# Patient Record
Sex: Female | Born: 1940 | Race: Black or African American
Health system: Southern US, Community
[De-identification: ages and names within clinical notes are randomized; demographics above are authoritative.]

## PROBLEM LIST (undated history)

## (undated) DIAGNOSIS — E785 Hyperlipidemia, unspecified: Secondary | ICD-10-CM

## (undated) DIAGNOSIS — I10 Essential (primary) hypertension: Secondary | ICD-10-CM

## (undated) HISTORY — DX: Hyperlipidemia, unspecified: E78.5

## (undated) HISTORY — DX: Essential (primary) hypertension: I10

## (undated) HISTORY — PX: ROTATOR CUFF REPAIR: SHX139

## (undated) HISTORY — PX: REPLACEMENT TOTAL KNEE: SUR1224

## (undated) HISTORY — PX: OTHER SURGICAL HISTORY: SHX169

## (undated) HISTORY — PX: CATARACT EXTRACTION: SUR2

---

## 1997-10-14 ENCOUNTER — Ambulatory Visit (HOSPITAL_COMMUNITY): Admission: RE | Admit: 1997-10-14 | Discharge: 1997-10-14 | Payer: Self-pay | Admitting: *Deleted

## 2000-04-15 ENCOUNTER — Encounter: Admission: RE | Admit: 2000-04-15 | Discharge: 2000-05-06 | Payer: Self-pay | Admitting: Occupational Medicine

## 2000-10-30 ENCOUNTER — Encounter: Admission: RE | Admit: 2000-10-30 | Discharge: 2000-10-30 | Payer: Self-pay | Admitting: Occupational Medicine

## 2000-10-30 ENCOUNTER — Encounter: Payer: Self-pay | Admitting: Occupational Medicine

## 2001-02-05 ENCOUNTER — Encounter: Payer: Self-pay | Admitting: Family Medicine

## 2001-02-05 ENCOUNTER — Ambulatory Visit (HOSPITAL_COMMUNITY): Admission: RE | Admit: 2001-02-05 | Discharge: 2001-02-05 | Payer: Self-pay | Admitting: Family Medicine

## 2001-02-12 ENCOUNTER — Encounter: Payer: Self-pay | Admitting: Family Medicine

## 2001-02-12 ENCOUNTER — Encounter: Admission: RE | Admit: 2001-02-12 | Discharge: 2001-02-12 | Payer: Self-pay | Admitting: Family Medicine

## 2001-03-31 ENCOUNTER — Encounter: Admission: RE | Admit: 2001-03-31 | Discharge: 2001-04-30 | Payer: Self-pay | Admitting: Neurosurgery

## 2001-11-24 ENCOUNTER — Ambulatory Visit (HOSPITAL_COMMUNITY): Admission: RE | Admit: 2001-11-24 | Discharge: 2001-11-24 | Payer: Self-pay | Admitting: Family Medicine

## 2001-11-24 ENCOUNTER — Encounter: Payer: Self-pay | Admitting: Family Medicine

## 2003-07-29 ENCOUNTER — Encounter: Admission: RE | Admit: 2003-07-29 | Discharge: 2003-07-29 | Payer: Self-pay | Admitting: Family Medicine

## 2003-09-08 ENCOUNTER — Ambulatory Visit (HOSPITAL_COMMUNITY): Admission: RE | Admit: 2003-09-08 | Discharge: 2003-09-08 | Payer: Self-pay | Admitting: Neurology

## 2004-04-05 ENCOUNTER — Emergency Department (HOSPITAL_COMMUNITY): Admission: EM | Admit: 2004-04-05 | Discharge: 2004-04-05 | Payer: Self-pay | Admitting: Emergency Medicine

## 2005-05-29 ENCOUNTER — Emergency Department (HOSPITAL_COMMUNITY): Admission: EM | Admit: 2005-05-29 | Discharge: 2005-05-29 | Payer: Self-pay | Admitting: Family Medicine

## 2005-07-08 ENCOUNTER — Encounter: Admission: RE | Admit: 2005-07-08 | Discharge: 2005-08-08 | Payer: Self-pay | Admitting: Family Medicine

## 2005-07-16 ENCOUNTER — Emergency Department (HOSPITAL_COMMUNITY): Admission: EM | Admit: 2005-07-16 | Discharge: 2005-07-16 | Payer: Self-pay | Admitting: Family Medicine

## 2006-03-17 ENCOUNTER — Encounter: Admission: RE | Admit: 2006-03-17 | Discharge: 2006-03-17 | Payer: Self-pay | Admitting: Family Medicine

## 2006-11-06 ENCOUNTER — Ambulatory Visit: Payer: Self-pay | Admitting: *Deleted

## 2006-11-06 ENCOUNTER — Encounter: Payer: Self-pay | Admitting: Emergency Medicine

## 2006-11-07 ENCOUNTER — Ambulatory Visit: Payer: Self-pay | Admitting: Internal Medicine

## 2006-11-07 ENCOUNTER — Inpatient Hospital Stay (HOSPITAL_COMMUNITY): Admission: EM | Admit: 2006-11-07 | Discharge: 2006-11-08 | Payer: Self-pay | Admitting: Cardiology

## 2006-12-18 ENCOUNTER — Ambulatory Visit (HOSPITAL_COMMUNITY): Admission: RE | Admit: 2006-12-18 | Discharge: 2006-12-18 | Payer: Self-pay | Admitting: Gastroenterology

## 2006-12-18 ENCOUNTER — Encounter (INDEPENDENT_AMBULATORY_CARE_PROVIDER_SITE_OTHER): Payer: Self-pay | Admitting: Gastroenterology

## 2007-02-06 ENCOUNTER — Encounter: Admission: RE | Admit: 2007-02-06 | Discharge: 2007-05-07 | Payer: Self-pay | Admitting: Internal Medicine

## 2007-05-14 ENCOUNTER — Encounter: Admission: RE | Admit: 2007-05-14 | Discharge: 2007-05-14 | Payer: Self-pay | Admitting: Family Medicine

## 2007-08-03 ENCOUNTER — Ambulatory Visit (HOSPITAL_COMMUNITY): Admission: RE | Admit: 2007-08-03 | Discharge: 2007-08-03 | Payer: Self-pay | Admitting: Internal Medicine

## 2007-09-27 ENCOUNTER — Encounter: Admission: RE | Admit: 2007-09-27 | Discharge: 2007-09-27 | Payer: Self-pay | Admitting: Family Medicine

## 2007-10-07 ENCOUNTER — Encounter: Admission: RE | Admit: 2007-10-07 | Discharge: 2007-10-07 | Payer: Self-pay | Admitting: Family Medicine

## 2007-11-19 ENCOUNTER — Ambulatory Visit (HOSPITAL_BASED_OUTPATIENT_CLINIC_OR_DEPARTMENT_OTHER): Admission: RE | Admit: 2007-11-19 | Discharge: 2007-11-20 | Payer: Self-pay | Admitting: Orthopaedic Surgery

## 2007-12-02 ENCOUNTER — Encounter: Admission: RE | Admit: 2007-12-02 | Discharge: 2007-12-23 | Payer: Self-pay | Admitting: Orthopaedic Surgery

## 2008-11-14 IMAGING — MG MM SCREEN MAMMOGRAM BILATERAL
5 series · 5 of 5 positions shown · non-contrast
Comparison: Prior studies.

DG SCREEN MAMMOGRAM BILATERAL
Bilateral CC and MLO view(s) were taken.

DIGITAL SCREENING MAMMOGRAM WITH CAD:

[R CC]
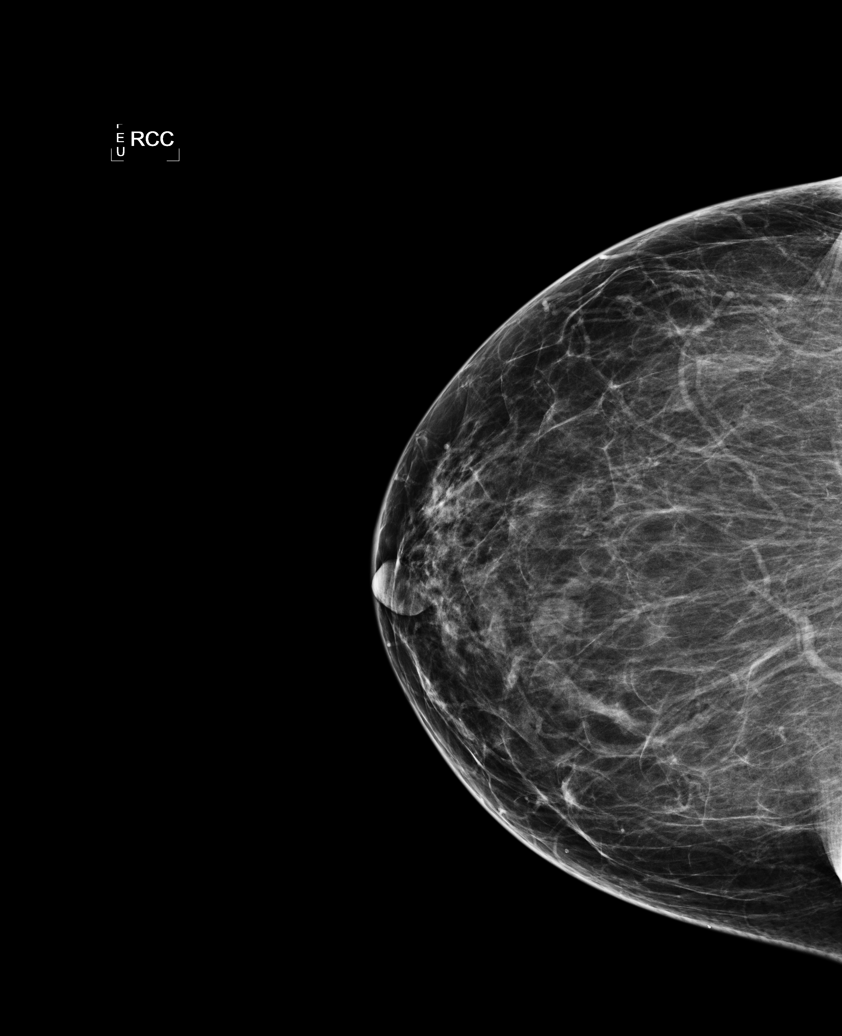

[L CC]
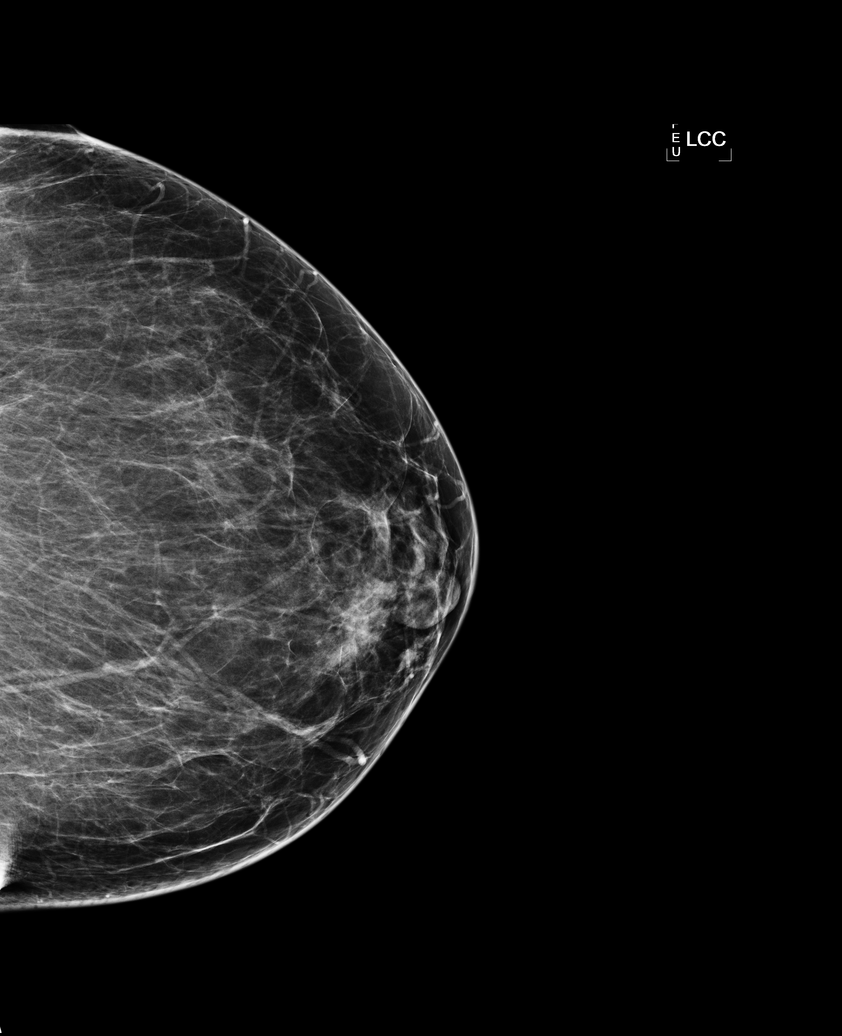

[L MLO]
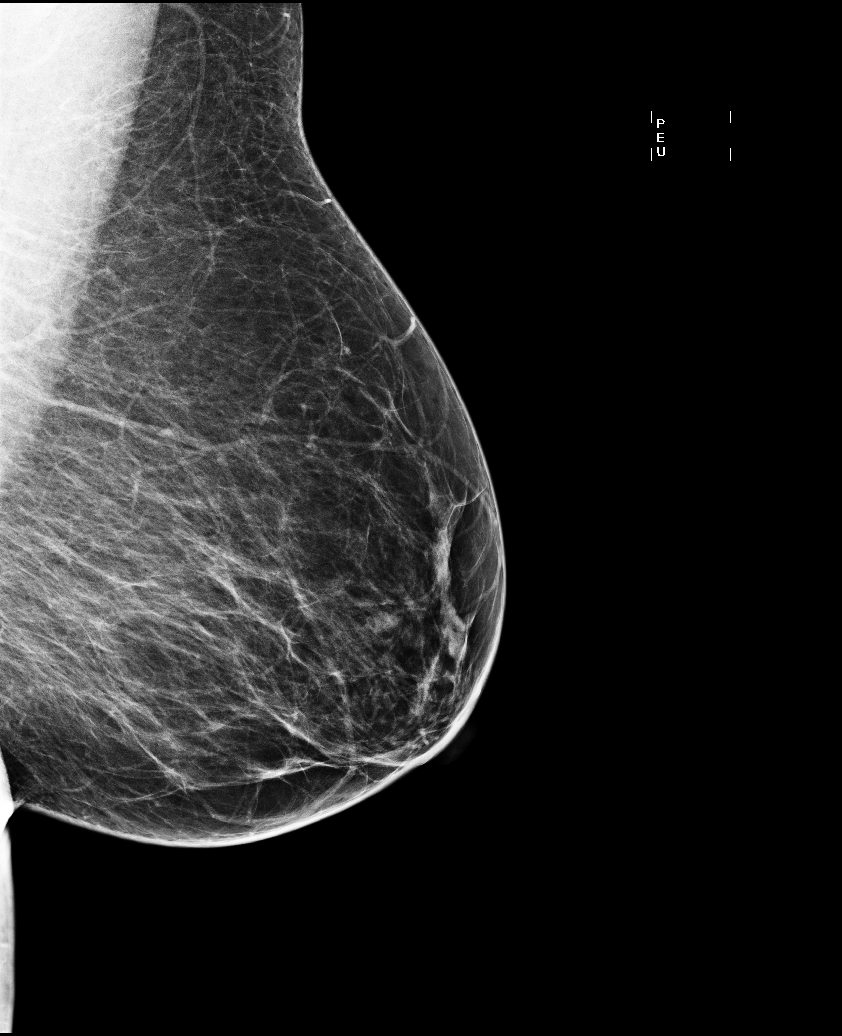

[R MLO (1 of 2)]
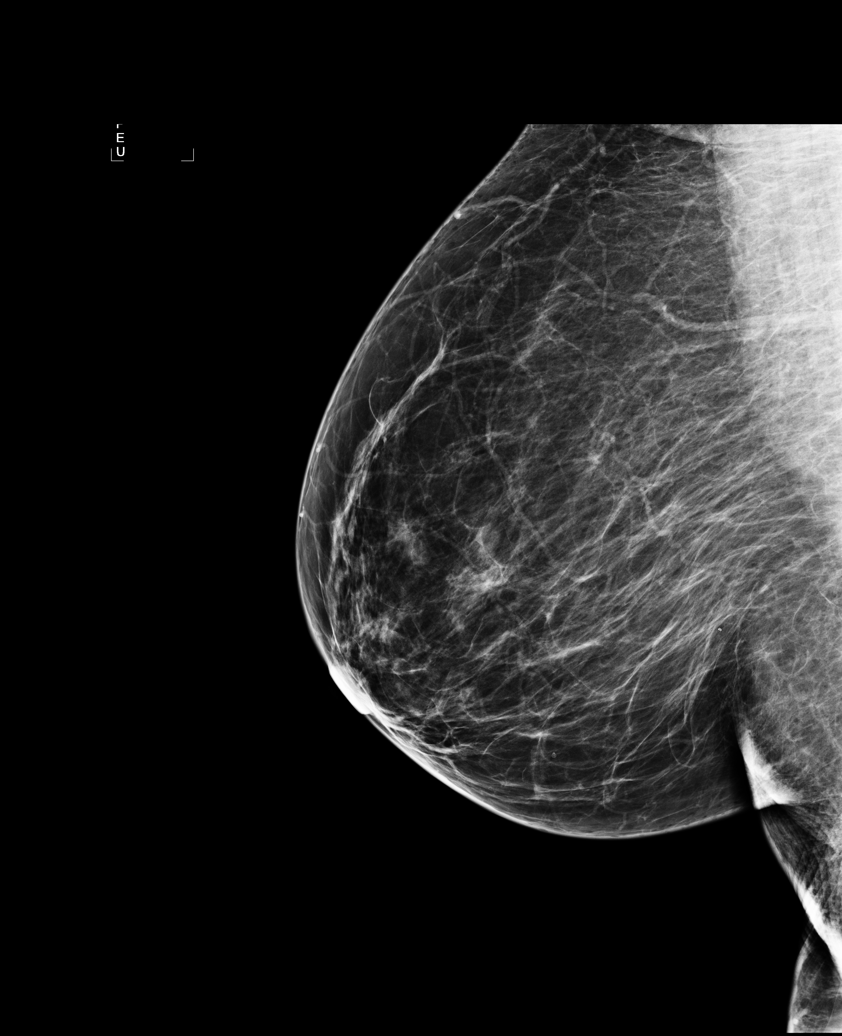

[R MLO (2 of 2)]
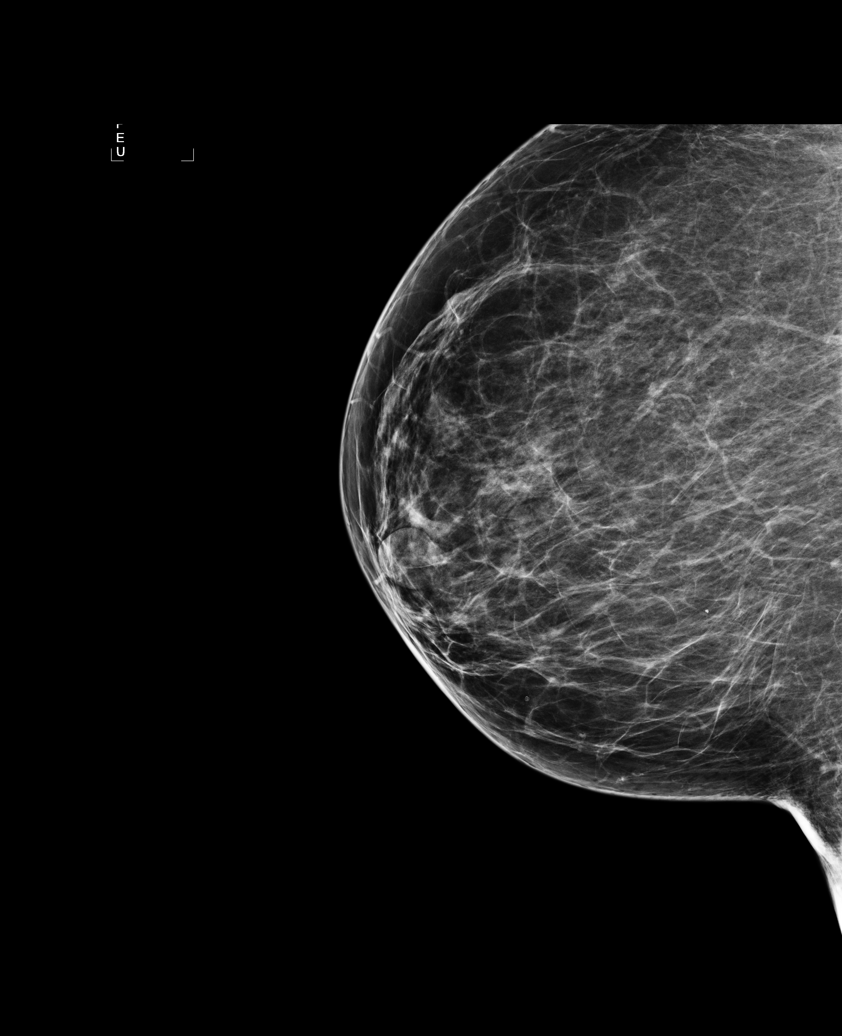

[5 of 5 positions shown; findings below may reference images not displayed]

There are scattered fibroglandular densities.  There is no dominant mass, architectural distortion 
or calcification to suggest malignancy.
IMPRESSION: No mammographic evidence of malignancy.  Suggest yearly screening mammography.

ASSESSMENT: Negative - BI-RADS 1

Screening mammogram in 1 year.
ANALYZED BY COMPUTER AIDED DETECTION. , THIS PROCEDURE WAS A DIGITAL MAMMOGRAM.

## 2009-01-31 ENCOUNTER — Encounter: Admission: RE | Admit: 2009-01-31 | Discharge: 2009-01-31 | Payer: Self-pay | Admitting: Family Medicine

## 2010-07-21 ENCOUNTER — Encounter: Payer: Self-pay | Admitting: Family Medicine

## 2010-11-13 NOTE — Cardiovascular Report (Signed)
NAMEMADELINE, PHO                   ACCOUNT NO.:  [id number]   MEDICAL RECORD NO.:  [id number]          PATIENT TYPE:  INP   LOCATION:  3708                         FACILITY:  [facility]   PHYSICIAN:  Arturo Morton. Riley Kill, MD, FACCDATE OF BIRTH:  12-Mar-1941   DATE OF PROCEDURE:  11/07/2006  DATE OF DISCHARGE:                            CARDIAC CATHETERIZATION   INDICATIONS:  Mrs. Preyer is a 70 year old female who works on [employer].  She  presented with chest pain.  She was noted have a potassium of 3.1.  She  was seen in consultation by Dr. Dietrich Pates, and subsequently cardiac  catheterization was recommended.  Incidental finding included a  microcytic anemia.  The etiology of this is unknown.   PROCEDURE:  1. Left heart catheterization.  2. Selective coronary arteriography.  3. Selective left ventriculography.   DESCRIPTION OF PROCEDURE:  Following informed consent, the patient was  brought to the catheterization laboratory, prepped and draped in usual  fashion and 1 mg of intravenous Versed was administered.  Xylocaine was  used for local anesthesia and through an anterior puncture, the right  femoral artery was easily entered on the first stick.  A 5-French sheath  was placed.  Views of the left and right coronaries were then obtained  in multiple angiographic projections.  Central aortic and left  ventricular pressures were measured with a pigtail.  Ventriculography  was done in the RAO projection.  There were no complications.  She was  taken to the holding area in satisfactory clinical condition.  Because  of the potassium of 3.3, she was given an additional 40 mEq of potassium  supplementation.   HEMODYNAMIC DATA:  1. Central aortic pressure 151/76.  2. Left ventricular pressure 154/17.  3. No gradient on pull-back across the aortic valve.   ANGIOGRAPHIC DATA:  1. The left main coronary is free of critical disease.  2. Left anterior descending artery courses to the apex.  There are  two      smaller diagonal branches, then a moderate size diagonal branch.      The distal LAD wraps the apex.  The vessel was moderately large and      free of critical narrowing.  3. The circumflex provides a large marginal branch, and also an AV      circumflex.  This vessel throughout appears smooth and without      significant narrowing and perhaps is slightly hypertrophied.  4. The right coronary artery provides a posterior descending and three      posterolateral branches, all of which appear to be smooth and free      of critical disease.   Ventriculography in the RAO projection reveals preserved global systolic  function.  Estimated ejection fraction is around 60%.  No definite wall  motion abnormalities are seen although there is a slight hangup in the  inferobasal segment.  Ejection fraction would be felt to be normal.   CONCLUSION:  1. Preserved overall left ventricular function.  2. Large nonobstructive coronary arteries as described in the above      text.  IMPRESSION:  1. Recent chest pain.  2. Hypokalemia secondary to probable diuretics.  3. Microcytic anemia of uncertain etiology.   RECOMMENDATIONS:  1. Replace potassium.  2. Follow up with Dr. bland regarding her microcytic anemia.  This has      been discussed with the patient and also with Dr. Parke Simmers directly      and we will obtain the iron studies which will be followed up on in      the outpatient area.      Arturo Morton. Riley Kill, MD, [facility]     TDS/MEDQ  D:  11/07/2006  T:  11/08/2006  Job:  [id number]   cc:   Pricilla Riffle, MD, [facility]  Renaye Rakers, M.D.  CV Laboratory

## 2010-11-13 NOTE — Discharge Summary (Signed)
[name], [name]                   ACCOUNT NO.:  [id number]   MEDICAL RECORD NO.:  [id number]          PATIENT TYPE:  INP   LOCATION:  3708                         FACILITY:  [facility]   PHYSICIAN:  Pricilla Riffle, MD, FACCDATE OF BIRTH:  [birth year]   DATE OF ADMISSION:  11/07/2006  DATE OF DISCHARGE:  11/08/2006                               DISCHARGE SUMMARY   PRIMARY CARE Aakash Hollomon:  Dr. Parke Simmers.   PRINCIPAL DIAGNOSIS:  Chest pain.   SECONDARY DIAGNOSES:  1. Hypertension.  2. Hypokalemia.  3. Microcytic anemia.   ALLERGIES:  No known drug allergies.   PROCEDURE:  Left heart cardiac catheterization.   HISTORY OF PRESENT ILLNESS:  A 70 year old female with a prior history  of hypertension, who was in her usual state of health until  approximately 11 a.m. on Nov 06, 2006, when she developed sudden onset of  left-sided chest pressure with radiation to the left upper extremity and  down the left arm.  This was worse with exertion and improved with rest.  Chest discomfort was intermittent throughout the day, thus prompting the  patient to present to the [facility] ED where she was evaluated.  Cardiac markers were negative, and ECG was without acute changes.  She  was admitted for further evaluation.   HOSPITAL COURSE:  [name] ruled out for MI, and decision was made to  pursue left heart cardiac catheterization.  Catheterization took place  on May 9 revealing normal coronary arteries with normal LV function.  This morning, she had been ambulating without recurrent discomfort or  other mentations.  She is being discharged home today in satisfactory  condition.   DISCHARGE LABS:  Hemoglobin 10.8, hematocrit 33.8, WBC 5.9, platelets  234, MCV 68.9, sodium 144, potassium 4.4, chloride 104, CO2 30, BUN 13,  creatinine 0.85, glucose 80, PT 14.3, INR 1.1, PTT 35, total bilirubin  0.7, alkaline phosphatase 70, AST 17, ALT 16, albumin 3.4, CK 73, MB  1.6, troponin I 0.03, total cholesterol  163, triglycerides 18, HDL 60,  LDL 99, calcium 9.3, magnesium 2.1.  Serum iron, TIBC, ferritin,  reticulocytes are pending.  D-dimer was less than 0.22.  BNP was less  than 30.  TSH was 2.349.   DISPOSITION:  The patient is being discharged home today in good  condition.   FOLLOWUP PLANS AND APPOINTMENTS:  She is asked to follow up with her  primary care Landyn Lorincz, Dr. Parke Simmers, in approximately 1-2 weeks for further  evaluation of microcytic anemia.  She does have iron studies pending  here.   DISCHARGE MEDICATIONS:  1. Norvasc 10 mg daily.  2. Diovan/HCTZ 160/25 mg daily.  3. Trazodone as previously prescribed.  4. Aspirin 81 mg daily.  5. K-Dur 10 mEq daily.   OUTSTANDING LAB STUDIES:  Iron, ferritin, TIBC, and reticulocytes.   DURATION OF DISCHARGE ENCOUNTER:  31 minutes including physician time.      Nicolasa Ducking, ANP      Pricilla Riffle, MD, [facility]  Electronically Signed    CB/MEDQ  D:  11/08/2006  T:  11/08/2006  Job:  [id number]   cc:   Renaye Rakers, M.D.

## 2010-11-13 NOTE — H&P (Signed)
NAMEMARKERIA, GOETSCH                   ACCOUNT NO.:  [id number]   MEDICAL RECORD NO.:  [id number]          PATIENT TYPE:  EMS   LOCATION:  ED                           FACILITY:  [facility]   PHYSICIAN:  Rod Holler, MD     DATE OF BIRTH:  [birth year]   DATE OF ADMISSION:  11/06/2006  DATE OF DISCHARGE:                              HISTORY & PHYSICAL   CHIEF COMPLAINT:  Chest pain.   HISTORY OF PRESENT ILLNESS:  [name] is a 70 year old female with a  history of hypertension who presents to the emergency department with  complaints of chest pain.  At 11:00 today while at rest, the patient had  onset of left-sided chest pressure, dull pain with radiation to her left  upper extremity and down the left arm. This discomfort was made worse  with walking up stairs and improved with rest.  The discomfort was  associated with some shortness of breath, nausea, diaphoresis.  The  chest discomfort continued throughout the day, continued to worsen with  activity and presents to the emergency department.  In the ER, the  patient was being started on nitroglycerin which improved her  discomfort.  At present, the patient's pain is much improved.  The  patient has had no recent syncope, palpitations, PND, or orthopnea or .  Lower extremity slowing.  In the emergency department, the patient was  given aspirin, nitroglycerin drip, morphine, Zofran, Lovenox 1 mg per kg  subcutaneously.   PAST MEDICAL HISTORY:  Hypertension.   MEDICATIONS:  1. Aspirin 81 mg p.o. daily.  2. Norvasc 5 mg po daily.  3. Diovan/HCT 160/25, one tab p.o. daily.   ALLERGIES:  No known drug allergies.   SOCIAL HISTORY:  The patient works as a Chief Strategy Officer at [employer].  Denies tobacco use.   FAMILY HISTORY:  No known history of coronary artery disease.   REVIEW OF SYSTEMS:  All systems were reviewed in detail and are negative  except as noted in the history of present illness.   PHYSICAL EXAMINATION:  VITAL SIGNS:   Blood pressure 135/72, heart rate  60, respiratory rate 18, oxygen saturation 99%.  GENERAL:  Well-developed, well-nourished female, alert and oriented x3,  in no apparent distress.  HEENT:  Atraumatic, normocephalic.  Pupils are equal, round and reactive  to light.  Extraocular movements intact.  Oropharynx clear.  NECK:  Supple.  No adenopathy.  No JVD.  No carotid bruits.  CHEST:  Lungs clear to auscultation bilaterally with equal bilateral  breath sounds.  CARDIOVASCULAR:  Regular rhythm.  Normal rate.  Normal S1 and S2.  No  murmurs, rubs or gallops. 2+ peripheral pulses.  ABDOMEN:  Soft, nontender, nondistended.  Active bowel sounds.  EXTREMITIES:  No clubbing or cyanosis.  NEUROLOGIC:  No focal deficits.   LABORATORY DATA:  EKG shows normal sinus rhythm with nonspecific ST-T  wave changes, questionable old septal infarct.  White blood cell count  8.1, hematocrit 33.8, MCV 68, platelet count 249.  Sodium 137, potassium  3.1, chloride 101, bicarb 29, BUN 23, creatinine  1.3, glucose 107,  myoglobin 57, troponin less than 0.05.  CK-MB 1.3.   IMPRESSION:  A 70 year old female who presents with complaints of chest  pain that is worse for acute coronary syndrome.  It is reassuring that  the pain has persistedfor approximately 12 hours with no elevation in  cardiac enzymes.   PLAN:  1. Admit the patient to a telemetry bed.  2. Transfer to [facility].  3. Aspirin.  4. Lipitor.  5. Continue home dose ARB and Norvasc. Given that the patient's heart      rate is in the 50s, unable to give beta blocker at this time.  The      patient was given therapeutic dose of Lovenox in the emergency      department, which we will continue.  6. Lipids in the morning along with serial cardiac enzymes.  7. Daily EKG.  8. Nitroglycerin drip, titrate until chest pain free.  9. NPO after midnight.  10.Replete potassium.  11.Normal saline 100 ml/hour.  12.Guaiac all stools.      Rod Holler, MD  Electronically Signed     TRK/MEDQ  D:  11/07/2006  T:  11/07/2006  Job:  [id number]

## 2010-11-13 NOTE — Op Note (Signed)
[name], [name]                   ACCOUNT NO.:  [id number]   MEDICAL RECORD NO.:  [id number]          PATIENT TYPE:  AMB   LOCATION:  DSC                          FACILITY:  [facility]   PHYSICIAN:  Claude Manges. Whitfield, M.D.DATE OF BIRTH:  11/07/1940   DATE OF PROCEDURE:  11/19/2007  DATE OF DISCHARGE:                               OPERATIVE REPORT   PREOPERATIVE DIAGNOSIS:  Rotator cuff tear, left shoulder with  degenerative joint disease, acromioclavicular joint.   POSTOPERATIVE DIAGNOSIS:  Rotator cuff tear, left shoulder with  degenerative joint disease, acromioclavicular joint.   PROCEDURE:  1. Arthroscopic debridement of left shoulder.  2. Arthroscopic subacromial decompression.  3. Arthroscopic distal clavicle resection.  4. Mini-open rotator cuff tear repair.   SURGEON:  Claude Manges. Cleophas Dunker, MD   ASSISTANT:  Oris Drone. Petrarca, P.A.-C.   ANESTHESIA:  General with supplemental interscalene nerve block.   COMPLICATIONS:  None.   HISTORY:  A 70 year old, a [employer] employee injured her left shoulder  on Oct 30, 2006, when she was helping a nurse pull the patient up in bed.  She experienced acute onset of pain, which reached to the point where  she was unable to perform activity overhead.  Despite physical therapy  and medicine, she continued to have discomfort.  She had an MRI scan  performed on September 27, 2007, revealing severe tendinopathy of the supra  and infraspinatus tendons with a full-thickness tear of the distal  infraspinatus.  There was also AC joint degenerative changes with  proliferative spurring that may result in impingement.  She is now a 70 year post injury, and continues to have pain and wishes to proceed with  arthroscopic evaluation.   PROCEDURE:  With the patient comfortable on operating table and under  general orotracheal anesthesia, the patient was placed in semi-sitting  position with the shoulder frame.  The left shoulder was then prepped  with  DuraPrep in the base of the neck circumferentially and below the  elbow, sterile draping was performed.   Marking pen was used to outline the [facility] joint, the coracoid, and the  acromion at a point of fingerbreadth posterior medial to the posterior  angle of acromion, a small stab wound was made.  The arthroscope was  easily placed into the shoulder joint.  Diagnostic arthroscopy revealed  partial tearing of the supra and infraspinatus attachment to the humeral  head.  There was no evidence of loose material.  There was minimal  synovitis.  I did not appreciate significant chondromalacia.  There was  some grade 2 changes of the glenoid.  The biceps tendon was intact.   A second portal was established anteriorly.  A series of the ArthroCare  wand and Cuda shaver, I debrided the partial rotator cuff tear.   The arthroscope was then placed in subacromial space posteriorly.  The  cannula subacromial space anteriorly and a third portal was established  in the lateral subacromial space.  There was considerable bursal tissue  that was debrided with the ArthroCare wand and the Cuda shaver.  I could  then  visualize the rotator cuff.  There appeared to be some bursal  surface tearing of the muscular portion of the cuff.  I did not see an  obvious tear of the tendinous portion of the infra and supraspinatus.  I  did a bursectomy.  There was considerable overhang of both the anterior  and lateral acromion, and a 6-mm bur was used to do an anterior and  lateral acromionectomy.  With evidence of significant degenerative  change with impingement of the [facility] joint, the distal clavicle resection  was performed with a 6-mm bur as well had a very nice resection.  There  was some minimal synovitis at the joint which was also resected.   With evidence of a cuff tear by MRI scan, I elected to perform a mini-  open incision.  About an inch incision was then made over the anterior  aspect of the shoulder and  carried down to the subcutaneous tissue.  Gross bleeders of Bovie coagulated.  The deltoid fascia was incised and  then separated.  The subacromial space was then identified.  There  appeared to be some tearing of the muscular portion of the cuff beneath  the [facility] joint and hypertrophied acromion.  I carefully checked the infra  and supraspinatus insertion.  I did not see an obvious tear, but I did  repair the frayed portions of the muscle, so that it was perfectly  intact.  The subacromial space was irrigated with saline solution.  The  deltoid fascia was closed with an interrupted 0 Vicryl, subcu with 2-0  Vicryl, and the skin was closed with Steri-Strips over Benzoin.  A  sterile bulky dressing was applied followed by a sling.   PLAN:  Recovery.   Discharged on [facility], for Pain Office 1 week.      Claude Manges. Cleophas Dunker, M.D.  Electronically Signed     PWW/MEDQ  D:  11/19/2007  T:  11/20/2007  Job:  [id number]

## 2011-03-27 LAB — BASIC METABOLIC PANEL
BUN: 19
Calcium: 9.3
Chloride: 102
Creatinine, Ser: 0.98
GFR calc non Af Amer: 57 — ABNORMAL LOW
Potassium: 3.8

## 2018-09-02 ENCOUNTER — Encounter (INDEPENDENT_AMBULATORY_CARE_PROVIDER_SITE_OTHER): Payer: Self-pay | Admitting: Cardiovascular Disease

## 2018-09-02 ENCOUNTER — Ambulatory Visit (INDEPENDENT_AMBULATORY_CARE_PROVIDER_SITE_OTHER): Payer: Medicare Other | Admitting: Cardiovascular Disease

## 2018-09-02 VITALS — BP 152/81 | HR 73 | Temp 98.3°F | Ht 64.7 in | Wt 230.6 lb

## 2018-09-02 DIAGNOSIS — I639 Cerebral infarction, unspecified: Secondary | ICD-10-CM

## 2018-09-02 DIAGNOSIS — I10 Essential (primary) hypertension: Secondary | ICD-10-CM

## 2018-09-02 MED ORDER — HYDROCHLOROTHIAZIDE 25 MG PO TABS
25.0000 mg | ORAL_TABLET | Freq: Every day | ORAL | 3 refills | Status: DC
Start: 2018-09-02 — End: 2018-09-09

## 2018-09-02 MED ORDER — AMLODIPINE BESYLATE 10 MG PO TABS
10.0000 mg | ORAL_TABLET | Freq: Every day | ORAL | 3 refills | Status: DC
Start: 2018-09-02 — End: 2018-09-09

## 2018-09-02 MED ORDER — ASPIRIN 81 MG PO CHEW
81.00 mg | CHEWABLE_TABLET | Freq: Every day | ORAL | 3 refills | Status: AC
Start: 2018-09-02 — End: 2018-12-01

## 2018-09-02 MED ORDER — VALSARTAN 320 MG PO TABS
320.00 mg | ORAL_TABLET | Freq: Every day | ORAL | 3 refills | Status: AC
Start: 2018-09-02 — End: 2018-12-01

## 2018-09-02 MED ORDER — ATORVASTATIN CALCIUM 40 MG PO TABS
40.0000 mg | ORAL_TABLET | Freq: Every day | ORAL | 3 refills | Status: DC
Start: 2018-09-02 — End: 2018-09-04

## 2018-09-02 NOTE — Progress Notes (Signed)
[facility] [place] OFFICE CONSULTATION      Chief Complaint   Patient presents with    Establish Care       I had the pleasure of seeing [name] today for cardiovascular evaluation. She is a pleasant 78 y.o. 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    She was referred for a possible TIA in December.  She awoke with sharp R-sided pain over her scalp.  The pain persisted down the R side of her head and she had R-sided weakness.  Could not walk well. She was taken to the hospital and was told she may have had a TIA. Symptoms lasted about an hour.  Carotid doppler showed mild disease.  MRI did not show any acute findings but chronic ischemic changes noted.  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 She has a PCP in [place] and family lives here in [place].    She has chronic DOE.  No exertional chest discomfort but occasional L sided pain at rest.  She states her SBPs are usually in the 130s-150s.  No LE edema.  She feels palpitations with climbing stairs, but otherwise denies palpitations.         PAST MEDICAL HISTORY:   Past Medical History:   Diagnosis Date    Hypertension          MEDICATIONS:     Current Outpatient Medications   Medication Sig Dispense Refill    amLODIPine (NORVASC) 10 MG tablet Take 1 tablet (10 mg total) by mouth daily 90 tablet 3    aspirin 81 MG chewable tablet Chew 1 tablet (81 mg total) by mouth daily 90 tablet 3    atorvastatin (LIPITOR) 40 MG tablet Take 1 tablet (40 mg total) by mouth daily 90 tablet 3    hydroCHLOROthiazide (HYDRODIURIL) 25 MG tablet Take 1 tablet (25 mg total) by mouth daily 90 tablet 3    valsartan (DIOVAN) 320 MG tablet Take 1 tablet (320 mg total) by mouth daily 90 tablet 3     No current facility-administered medications for this visit.            ALLERGIES: No Known Allergies      FAMILY HISTORY: Her family history includes Hypertension  in her brother, mother, and sister; No known problems in her father.  No premature CAD.      SOCIAL HISTORY: She reports that she has never smoked. She has never used smokeless tobacco. She reports that she does not drink alcohol or use drugs.      REVIEW OF SYSTEMS: All other systems reviewed and negative except as above.         PHYSICAL EXAMINATION  Vital Signs: BP 152/81 (BP Site: Right arm, Patient Position: Sitting, Cuff Size: Large)    Pulse 73    Temp 98.3 F (36.8 C)    Ht 1.643 m (5' 4.7")    Wt 104.6 kg (230 lb 9.6 oz)    SpO2 98%    BMI 38.73 kg/m    Vital signs reviewed    Wt Readings from Last 3 Encounters:   09/02/18 104.6 kg (230 lb 9.6 oz)        General Appearance:  A well-appearing female in no acute distress.    HEENT: Sclera anicteric, conjunctiva without pallor, moist mucous membranes, normal dentition.   Neck:  Supple without jugular venous distention.  Normal carotid upstrokes without bruits.   Chest: Clear to auscultation bilaterally with good air movement and respiratory effort and no wheezes, rales, or rhonchi   Cardiac: RRR.  Normal S1 and physiologically split S2, without gallops or rub. No murmurs.    Vascular:  2+ carotid, radial pulses bilaterally  Abdomen: Soft, nontender, nondistended, with normoactive bowel sounds.  No bruits.   Extremities: Warm without edema, clubbing, or cyanosis.   Skin: No rash, warm, appropriate for race.   Neuro: Alert and oriented x3. Grossly intact.  CN II-XII intact.  Normal mood and affect.       ECG:    Independent review shows:  NSR, LVH, NS T wave changes    ASSESSMENT/PLAN:    1. TIA.  Recommend starting statin.  Will get baseline lipid panel and echo.  2. HTN.  Uncontrolled.  Recommend increasing diovan to 320mg  daily and cont with HCTZ and amlodipine.  Check BMP.        All patient's questions and concerns regarding cardiovascular disease were answered during this visit.    Orders Placed This Encounter   Procedures    Basic Metabolic Panel     Lipid panel    ECG 12 lead (Normal)    Transthoracic Echocardiogram (TTE)       Return in about 2 weeks (around 09/16/2018).    Governor Specking, MD  09/02/2018

## 2018-09-04 ENCOUNTER — Ambulatory Visit
Admission: RE | Admit: 2018-09-04 | Discharge: 2018-09-04 | Disposition: A | Discharge status: Home / Self Care | Payer: Medicare Other | Source: Ambulatory Visit | Attending: Cardiovascular Disease | Admitting: Cardiovascular Disease

## 2018-09-04 ENCOUNTER — Ambulatory Visit (INDEPENDENT_AMBULATORY_CARE_PROVIDER_SITE_OTHER): Payer: Medicare Other

## 2018-09-04 ENCOUNTER — Other Ambulatory Visit (INDEPENDENT_AMBULATORY_CARE_PROVIDER_SITE_OTHER): Payer: Self-pay | Admitting: Cardiovascular Disease

## 2018-09-04 ENCOUNTER — Ambulatory Visit
Admission: RE | Admit: 2018-09-04 | Discharge: 2018-09-04 | Disposition: A | Discharge status: Home / Self Care | Payer: Medicare Other | Source: Ambulatory Visit | Attending: Neurology | Admitting: Neurology

## 2018-09-04 DIAGNOSIS — I639 Cerebral infarction, unspecified: Secondary | ICD-10-CM

## 2018-09-04 DIAGNOSIS — I10 Essential (primary) hypertension: Secondary | ICD-10-CM | POA: Insufficient documentation

## 2018-09-04 DIAGNOSIS — R51 Headache: Secondary | ICD-10-CM | POA: Insufficient documentation

## 2018-09-04 LAB — BASIC METABOLIC PANEL
BUN: 17 mg/dL (ref 7.0–19.0)
CO2: 30 mEq/L — ABNORMAL HIGH (ref 21–29)
Calcium: 9.2 mg/dL (ref 7.9–10.2)
Chloride: 102 mEq/L (ref 100–111)
Creatinine: 0.9 mg/dL (ref 0.4–1.5)
Glucose: 97 mg/dL (ref 70–100)
Potassium: 4.1 mEq/L (ref 3.5–5.1)
Sodium: 142 mEq/L (ref 136–145)

## 2018-09-04 LAB — GFR: EGFR: >60

## 2018-09-04 LAB — SEDIMENTATION RATE: Sed Rate: 16 mm/Hr (ref 0–20)

## 2018-09-04 LAB — LIPID PANEL
Cholesterol / HDL Ratio: 3.2
Cholesterol: 174 mg/dL (ref 0–199)
HDL: 55 mg/dL (ref 40–9999)
LDL Calculated: 109 mg/dL — ABNORMAL HIGH (ref 0–99)
Triglycerides: 51 mg/dL (ref 34–149)
VLDL Calculated: 10 mg/dL (ref 10–40)

## 2018-09-04 LAB — HEMOLYSIS INDEX: Hemolysis Index: 8 (ref 0–18)

## 2018-09-04 MED ORDER — ATORVASTATIN CALCIUM 40 MG PO TABS
40.00 mg | ORAL_TABLET | Freq: Every day | ORAL | 3 refills | Status: AC
Start: 2018-09-04 — End: 2018-12-03

## 2018-09-07 ENCOUNTER — Telehealth (INDEPENDENT_AMBULATORY_CARE_PROVIDER_SITE_OTHER): Payer: Self-pay | Admitting: Internal Medicine

## 2018-09-07 ENCOUNTER — Other Ambulatory Visit (INDEPENDENT_AMBULATORY_CARE_PROVIDER_SITE_OTHER): Payer: Self-pay | Admitting: Cardiovascular Disease

## 2018-09-07 ENCOUNTER — Telehealth (INDEPENDENT_AMBULATORY_CARE_PROVIDER_SITE_OTHER): Payer: Self-pay

## 2018-09-07 DIAGNOSIS — I10 Essential (primary) hypertension: Secondary | ICD-10-CM

## 2018-09-07 NOTE — Telephone Encounter (Signed)
Valsartan prescription phoned in.

## 2018-09-07 NOTE — Telephone Encounter (Signed)
I called and left a voicemail relaying Dr. Park's message.

## 2018-09-07 NOTE — Telephone Encounter (Addendum)
Result mailed to patient per Dr. Thelma Barge.    ----- Message from Janace Litten, MD sent at 09/06/2018  7:28 PM EDT -----  Regarding: Echo - 6  No acute findings please follow-up with ordering cardiologist

## 2018-09-07 NOTE — Progress Notes (Signed)
I called and left a voicemail relaying Dr. Park's message.

## 2018-09-07 NOTE — Telephone Encounter (Signed)
-----   Message from Governor Specking, MD sent at 09/07/2018 10:55 AM EDT -----  Labs WNLs. Cont current meds and f/u as scheduled.

## 2018-09-09 ENCOUNTER — Encounter (INDEPENDENT_AMBULATORY_CARE_PROVIDER_SITE_OTHER): Payer: Self-pay | Admitting: Cardiovascular Disease

## 2018-09-09 ENCOUNTER — Ambulatory Visit (INDEPENDENT_AMBULATORY_CARE_PROVIDER_SITE_OTHER): Payer: Medicare Other | Admitting: Cardiovascular Disease

## 2018-09-09 VITALS — BP 158/78 | Resp 18 | Ht 64.7 in | Wt 232.0 lb

## 2018-09-09 DIAGNOSIS — I10 Essential (primary) hypertension: Secondary | ICD-10-CM

## 2018-09-09 DIAGNOSIS — I639 Cerebral infarction, unspecified: Secondary | ICD-10-CM

## 2018-09-09 MED ORDER — AMLODIPINE BESYLATE 10 MG PO TABS
10.00 mg | ORAL_TABLET | Freq: Every day | ORAL | 3 refills | Status: AC
Start: 2018-09-09 — End: 2018-12-08

## 2018-09-09 MED ORDER — HYDROCHLOROTHIAZIDE 25 MG PO TABS
25.00 mg | ORAL_TABLET | Freq: Every day | ORAL | 3 refills | Status: AC
Start: 2018-09-09 — End: 2018-12-08

## 2018-09-09 NOTE — Progress Notes (Signed)
CARDIOLOGY OFFICE NOTE  Jayko Voorhees Alyson Locket, MD, [facility]     DATE OF SERVICE:  09/09/2018  PATIENT:  Catherine Meadows     (DOB:  06/29/1941  78 y.o.  female)  PCP:  Pcp, None, MD      ASSESSMENT AND PLAN:    --- Patient is doing well with borderline hypertension.  We will continue current medications.  We will focus on lifestyle modification with increase aerobic exercise, increase sleep, and decrease sodium intake.  --- I discussed findings of echocardiogram including dilated aorta and consider follow-up ECHO in 1 year.  --- .    --- .    ORDERS:  No orders of the defined types were placed in this encounter.  FOLLOW UP:  Return in about 3 months (around 12/10/2018) for follow up with Dr. Willaim Bane..     -----------------------  HISTORY OF PRESENT ILLNESS:  Patient presents for continued cardiovascular care    Patient has a history of TIA in December and Hypertension.  Patient reports good health and denies dizziness and chest pain.  She has chronic dyspnea on exertion walking upstairs.  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  Blood pressure diary shows fair control with systolic blood pressure mostly in the 130-140 range and occasional 150 systolic.  She sleeps 5 to 6 hours a day.  Patient does not exercise regularly.    ECG:  ---    PERTINENT PAST MEDICAL HISTORY:    *Hypertension  *TIA    PHYSICAL EXAM:   BP 158/78 (BP Site: Right arm, Patient Position: Sitting, Cuff Size: Large)    Resp 18    Ht 1.643 m (5' 4.7")    Wt 105.2 kg (232 lb)    SpO2 96%    BMI 38.97 kg/m   >> Constitutional:   no distress,  >> Pulmonary:   unlabored respiration,  no wheeze,  no crackles,  >> Cardiovascular:   regular rhythm,  normal S1S2,  >> Neuro/Psychiatic:   alert,  oriented,  appropriate mood and affect,     MEDICATIONS:      Current Outpatient Medications:     amLODIPine (NORVASC) 10 MG tablet, Take 1 tablet (10 mg total) by mouth daily, Disp: 90 tablet, Rfl: 3    aspirin 81 MG  chewable tablet, Chew 1 tablet (81 mg total) by mouth daily, Disp: 90 tablet, Rfl: 3    atorvastatin (LIPITOR) 40 MG tablet, Take 1 tablet (40 mg total) by mouth daily, Disp: 90 tablet, Rfl: 3    hydroCHLOROthiazide (HYDRODIURIL) 25 MG tablet, Take 1 tablet (25 mg total) by mouth daily, Disp: 90 tablet, Rfl: 3    valsartan (DIOVAN) 320 MG tablet, Take 1 tablet (320 mg total) by mouth daily, Disp: 90 tablet, Rfl: 3      Signature,     Nicolet Griffy Alyson Locket, MD, [facility]    Tel:  [phone] Fax:  [phone]  [facility]:  [facility] [place] - Faythe Dingwall - Norberto Sorenson

## 2018-11-24 ENCOUNTER — Other Ambulatory Visit (INDEPENDENT_AMBULATORY_CARE_PROVIDER_SITE_OTHER): Payer: Self-pay | Admitting: Cardiovascular Disease

## 2018-11-24 DIAGNOSIS — I10 Essential (primary) hypertension: Secondary | ICD-10-CM

## 2018-11-24 MED ORDER — LOSARTAN POTASSIUM 100 MG PO TABS
100.00 mg | ORAL_TABLET | Freq: Every day | ORAL | 3 refills | Status: AC
Start: 2018-11-24 — End: 2019-02-22

## 2018-12-16 ENCOUNTER — Encounter (INDEPENDENT_AMBULATORY_CARE_PROVIDER_SITE_OTHER): Payer: Self-pay | Admitting: Cardiovascular Disease

## 2018-12-16 ENCOUNTER — Telehealth (INDEPENDENT_AMBULATORY_CARE_PROVIDER_SITE_OTHER): Payer: Medicare Other | Admitting: Cardiovascular Disease

## 2018-12-16 VITALS — BP 125/69 | HR 74 | Ht 64.0 in | Wt 236.6 lb

## 2018-12-16 DIAGNOSIS — I10 Essential (primary) hypertension: Secondary | ICD-10-CM

## 2018-12-16 MED ORDER — CARVEDILOL 6.25 MG PO TABS
6.2500 mg | ORAL_TABLET | Freq: Two times a day (BID) | ORAL | 3 refills | Status: DC
Start: 2018-12-16 — End: 2020-01-01

## 2018-12-16 NOTE — Progress Notes (Signed)
TELEMEDICINE VIDEO VISIT      CONSENT: Verbal consent has been obtained from the patient to conduct a video visit encounter to minimize exposure to COVID-19.  The time spent in medical discussion during this visit was 11 minutes.      CARDIOLOGY OFFICE NOTE  Alahna Dunne Alyson Locket, MD, [facility]     DATE OF SERVICE:  12/16/2018  PATIENT:  Catherine Meadows     (DOB:  [birth year]  78 y.o.  female)  PCP:  Pcp, None, MD      ASSESSMENT AND PLAN:    *Patient presents with uncontrolled hypertension.  We will add carvedilol to the regimen.    *Patient does not report symptoms of sleep apnea.  We will continue to observe.    *Consider ECHO in March 2021  *  *  ORDERS:  No orders of the defined types were placed in this encounter.  FOLLOW UP:  Return in about 1 month (around 01/15/2019) for a visit by video.Marland Kitchen     -----------------------  HISTORY OF PRESENT ILLNESS:  *HTN  *TIA  *Dilated aorta    12/16/18  Patient presents for continued cardiovascular care  Yesterday had BP 148/78 and yesterday 164/91 without CP/SOB/HA  Sleeps 5 hours a day  Denies fatigue and hypersomnolence    09/09/18  Patient has a history of TIA in December and Hypertension.  Patient reports good health and denies dizziness and chest pain.  She has chronic dyspnea on exertion walking upstairs.  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  Blood pressure diary shows fair control with systolic blood pressure mostly in the 130-140 range and occasional 150 systolic.  She sleeps 5 to 6 hours a day.  Patient does not exercise regularly.    ECG:  ---    PERTINENT PAST MEDICAL HISTORY:    *Hypertension  *TIA  *dilated aorta    ECHO 09/04/2018 showed normal LV function with mild aortic insufficiency and mildly dilated aorta 4.2 cm.    PHYSICAL EXAM:   BP 125/69 (BP Site: Left arm, Patient Position: Sitting, Cuff Size: Large)    Pulse 74    Ht 1.626 m (5\' 4" )    Wt 107.3 kg (236 lb 9.6 oz)    BMI 40.61 kg/m   The following  limited physical exam components were observed via video:  General Appearance:  Alert, cooperative, no distress, appears stated age   Head: Normocephalic, without obvious abnormality, atraumatic   Eyes:  Sclera anicteric, conjunctiva without pallor   Lungs:   Breathing comfortably without accessory muscle use. Normal respiratory effort.    Musculoskeletal: No apparent upper extremity abnormalities    Skin: Normal skin color, no diaphoresis   Extremities: No apparent edema   Neurologic: Alert and oriented x3, normal mood and affect. Grossly intact.         MEDICATIONS:      Current Outpatient Medications:     amLODIPine (NORVASC) 10 MG tablet, Take 10 mg by mouth daily, Disp: , Rfl:     atorvastatin (LIPITOR) 40 MG tablet, Take 40 mg by mouth daily, Disp: , Rfl:     clopidogrel (PLAVIX) 75 mg tablet, TK 1 T PO ONCE D, Disp: , Rfl:     hydroCHLOROthiazide (HYDRODIURIL) 25 MG tablet, Take 25 mg by mouth daily, Disp: , Rfl:     losartan (COZAAR) 100 MG tablet, Take 1 tablet (100 mg total) by mouth daily, Disp: 90 tablet, Rfl: 3  carvedilol (COREG) 6.25 MG tablet, Take 1 tablet (6.25 mg total) by mouth 2 (two) times daily with meals, Disp: 180 tablet, Rfl: 3      Signature,     Jamere Stidham Alyson Locket, MD, [facility]    Tel:  [phone] Fax:  [phone]  [facility]:  [facility] - Faythe Dingwall - Norberto Sorenson

## 2019-01-19 ENCOUNTER — Telehealth (INDEPENDENT_AMBULATORY_CARE_PROVIDER_SITE_OTHER): Payer: Medicare Other | Admitting: Cardiovascular Disease

## 2019-01-19 ENCOUNTER — Encounter (INDEPENDENT_AMBULATORY_CARE_PROVIDER_SITE_OTHER): Payer: Self-pay | Admitting: Cardiovascular Disease

## 2019-01-19 DIAGNOSIS — I10 Essential (primary) hypertension: Secondary | ICD-10-CM

## 2019-01-19 DIAGNOSIS — G459 Transient cerebral ischemic attack, unspecified: Secondary | ICD-10-CM

## 2019-01-19 NOTE — Progress Notes (Signed)
TELEMEDICINE VIDEO VISIT      CONSENT: Verbal consent has been obtained from the patient to conduct a video visit encounter to minimize exposure to COVID-19.  The time spent in medical discussion during this visit was 7 minutes.      CARDIOLOGY OFFICE NOTE  [name], MD, [facility]     DATE OF SERVICE:  01/19/2019  PATIENT:  Catherine Meadows     (DOB:  1940/09/01  78 y.o.  female)  PCP:  Pcp, None, MD      ASSESSMENT AND PLAN:    *HTN --- controlled.  Asymptomatic.  She does not have symptoms of sleep apnea but will continue to observe.  *TIA --- continue Plavix and Lipitor  *Dilated aorta --- will follow.  Consider ECHO in March 2021  *Referred to primary care  *  ORDERS:  No orders of the defined types were placed in this encounter.  FOLLOW UP:  Return in about 6 months (around 07/22/2019) for in-office follow up with ECG, a visit by video.Marland Kitchen     -----------------------  HISTORY OF PRESENT ILLNESS:    12/16/18  Patient presents for continued cardiovascular care  Yesterday had BP 148/78 and yesterday 164/91 without CP/SOB/HA  Sleeps 5 hours a day  Denies fatigue and hypersomnolence    09/09/18  Patient has a history of TIA in December and Hypertension.  Patient reports good health and denies dizziness and chest pain.  She has chronic dyspnea on exertion walking upstairs.  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  Blood pressure diary shows fair control with systolic blood pressure mostly in the 130-140 range and occasional 150 systolic.  She sleeps 5 to 6 hours a day.  Patient does not exercise regularly.    ECG:  ---    PERTINENT PAST MEDICAL HISTORY:    *Hypertension  *TIA  *dilated aorta    ECHO 09/04/2018 showed normal LV function with mild aortic insufficiency and mildly dilated aorta 4.2 cm.    SH: does not smoke    PHYSICAL EXAM:   BP 129/73    Pulse 73    Wt 101.8 kg (224 lb 6.4 oz)    BMI 38.52 kg/m   The following limited physical exam components  were observed via video:  General Appearance:  Alert, cooperative   Head:    Eyes:  Sclera anicteric   Lungs:   Breathing comfortably without accessory muscle use. Normal respiratory effort.    Musculoskeletal:    Skin:    Extremities:    Neurologic: Alert and oriented x3         MEDICATIONS:      Current Outpatient Medications:     amLODIPine (NORVASC) 10 MG tablet, Take 10 mg by mouth daily, Disp: , Rfl:     atorvastatin (LIPITOR) 40 MG tablet, Take 40 mg by mouth daily, Disp: , Rfl:     carvedilol (COREG) 6.25 MG tablet, Take 1 tablet (6.25 mg total) by mouth 2 (two) times daily with meals, Disp: 180 tablet, Rfl: 3    clopidogrel (PLAVIX) 75 mg tablet, TK 1 T PO ONCE D, Disp: , Rfl:     hydroCHLOROthiazide (HYDRODIURIL) 25 MG tablet, Take 25 mg by mouth daily, Disp: , Rfl:     losartan (COZAAR) 100 MG tablet, Take 1 tablet (100 mg total) by mouth daily, Disp: 90 tablet, Rfl: 3      Signature,     Prudy Feeler  Loma Newton, MD, [facility]    Tel:  [phone] Fax:  [phone]  [facility]:  [facility] [place] - Faythe Dingwall - Norberto Sorenson

## 2019-07-06 ENCOUNTER — Encounter (INDEPENDENT_AMBULATORY_CARE_PROVIDER_SITE_OTHER): Payer: Self-pay | Admitting: Cardiovascular Disease

## 2019-07-06 ENCOUNTER — Telehealth (INDEPENDENT_AMBULATORY_CARE_PROVIDER_SITE_OTHER): Payer: Medicare (Managed Care) | Admitting: Cardiovascular Disease

## 2019-07-06 ENCOUNTER — Other Ambulatory Visit (INDEPENDENT_AMBULATORY_CARE_PROVIDER_SITE_OTHER): Payer: Self-pay

## 2019-07-06 DIAGNOSIS — I77810 Thoracic aortic ectasia: Secondary | ICD-10-CM

## 2019-07-06 DIAGNOSIS — I10 Essential (primary) hypertension: Secondary | ICD-10-CM

## 2019-07-06 DIAGNOSIS — G459 Transient cerebral ischemic attack, unspecified: Secondary | ICD-10-CM

## 2019-07-06 DIAGNOSIS — Z7902 Long term (current) use of antithrombotics/antiplatelets: Secondary | ICD-10-CM

## 2019-07-06 MED ORDER — HYDROCHLOROTHIAZIDE 25 MG PO TABS
25.0000 mg | ORAL_TABLET | Freq: Every day | ORAL | 1 refills | Status: DC
Start: 2019-07-06 — End: 2020-03-15

## 2019-07-06 MED ORDER — CARVEDILOL 6.25 MG PO TABS
6.2500 mg | ORAL_TABLET | Freq: Two times a day (BID) | ORAL | 1 refills | Status: DC
Start: 2019-07-06 — End: 2020-02-04

## 2019-07-06 MED ORDER — LOSARTAN POTASSIUM 100 MG PO TABS
100.0000 mg | ORAL_TABLET | Freq: Every day | ORAL | 1 refills | Status: DC
Start: 2019-07-06 — End: 2020-02-04

## 2019-07-06 MED ORDER — AMLODIPINE BESYLATE 10 MG PO TABS
10.0000 mg | ORAL_TABLET | Freq: Every day | ORAL | 1 refills | Status: DC
Start: 2019-07-06 — End: 2020-02-04

## 2019-07-06 MED ORDER — ATORVASTATIN CALCIUM 40 MG PO TABS
40.0000 mg | ORAL_TABLET | Freq: Every day | ORAL | 1 refills | Status: DC
Start: 2019-07-06 — End: 2020-03-15

## 2019-07-06 MED ORDER — CLOPIDOGREL BISULFATE 75 MG PO TABS
75.0000 mg | ORAL_TABLET | Freq: Every day | ORAL | 1 refills | Status: DC
Start: 2019-07-06 — End: 2020-03-15

## 2019-07-06 NOTE — Progress Notes (Signed)
TELEMEDICINE VIDEO VISIT      CONSENT: Verbal consent has been obtained from the patient to conduct a video visit encounter to minimize exposure to COVID-19.  The time spent in medical discussion during this visit was 8 minutes.      CARDIOLOGY OFFICE NOTE  Harleyquinn Gasser Alyson Locket, MD, [facility]     DATE OF SERVICE:  07/06/2019  PATIENT:  Catherine Meadows     (DOB:  04-06-1941  79 y.o.  female)  PCP:  Pcp, None, MD      ASSESSMENT AND PLAN:    *HTN  - controlled.  Continue current medications.  E prescription renewed for 6 months supply    *TIA   -Asymptomatic.  Continue Plavix and Lipitor    *Dilated aorta    -Plan for ECHO later this year    *  ORDERS:  No orders of the defined types were placed in this encounter.  FOLLOW UP:  Return in about 7 months (around 02/03/2020) for or a visit by video, office visit with ECG..     -----------------------  HISTORY OF PRESENT ILLNESS:  Presents for follow up of HTN, TIA and dilated aorta  Patient reports good health.  She denies chest pain and shortness of breath.  She reports blood pressures under control with readings of 127/74 and 70 bpm  Denies chest pain or shortness of breath  Will go to [place] for the next 6 months    ECHO 09/04/2018 showed normal LV function with mild aortic insufficiency and mildly dilated aorta 4.2 cm    ECG:  ---    PERTINENT PAST MEDICAL HISTORY:    *Hypertension  *TIA  *dilated aorta    ECHO 09/04/2018 showed normal LV function with mild aortic insufficiency and mildly dilated aorta 4.2 cm.    SH: does not smoke    PHYSICAL EXAM:   Wt 102.5 kg (226 lb)    BMI 38.79 kg/m   The following limited physical exam components were observed via video:  General Appearance:  Alert, cooperative   Head:    Eyes:     Lungs:   Breathing comfortably without accessory muscle use. Normal respiratory effort.    Musculoskeletal:    Skin:    Extremities:    Neurologic: Alert and oriented x3         MEDICATIONS:      Current Outpatient Medications:     amLODIPine (NORVASC) 10 MG tablet,  Take 1 tablet (10 mg total) by mouth daily, Disp: 180 tablet, Rfl: 1    atorvastatin (LIPITOR) 40 MG tablet, Take 1 tablet (40 mg total) by mouth daily, Disp: 180 tablet, Rfl: 1    carvedilol (COREG) 6.25 MG tablet, Take 1 tablet (6.25 mg total) by mouth 2 (two) times daily with meals, Disp: 360 tablet, Rfl: 1    clopidogrel (PLAVIX) 75 mg tablet, Take 1 tablet (75 mg total) by mouth daily, Disp: 180 tablet, Rfl: 1    hydroCHLOROthiazide (HYDRODIURIL) 25 MG tablet, Take 1 tablet (25 mg total) by mouth daily, Disp: 180 tablet, Rfl: 1    losartan (COZAAR) 100 MG tablet, Take 1 tablet (100 mg total) by mouth daily, Disp: 180 tablet, Rfl: 1      Signature,     Skyanne Welle Alyson Locket, MD, [facility]    Tel:  [phone] Fax:  [phone]   [facility]:  [facility] [place] - Faythe Dingwall - [place]berto Sorenson

## 2019-07-12 MED ORDER — AMLODIPINE BESYLATE 10 MG PO TABS
10.0000 mg | ORAL_TABLET | Freq: Every day | ORAL | 1 refills | Status: DC
Start: 2019-07-12 — End: 2020-03-15

## 2019-07-12 MED ORDER — LOSARTAN POTASSIUM 100 MG PO TABS
100.0000 mg | ORAL_TABLET | Freq: Every day | ORAL | 1 refills | Status: DC
Start: 2019-07-12 — End: 2020-03-15

## 2019-07-12 MED ORDER — HYDROCHLOROTHIAZIDE 25 MG PO TABS
25.0000 mg | ORAL_TABLET | Freq: Every day | ORAL | 1 refills | Status: DC
Start: 2019-07-12 — End: 2020-02-04

## 2019-07-12 MED ORDER — CLOPIDOGREL BISULFATE 75 MG PO TABS
75.0000 mg | ORAL_TABLET | Freq: Every day | ORAL | 1 refills | Status: DC
Start: 2019-07-12 — End: 2020-02-04

## 2019-07-12 MED ORDER — ATORVASTATIN CALCIUM 40 MG PO TABS
40.0000 mg | ORAL_TABLET | Freq: Every day | ORAL | 1 refills | Status: DC
Start: 2019-07-12 — End: 2019-09-10

## 2019-07-12 MED ORDER — CARVEDILOL 6.25 MG PO TABS
6.2500 mg | ORAL_TABLET | Freq: Two times a day (BID) | ORAL | 1 refills | Status: DC
Start: 2019-07-12 — End: 2020-03-15

## 2019-09-10 ENCOUNTER — Other Ambulatory Visit (INDEPENDENT_AMBULATORY_CARE_PROVIDER_SITE_OTHER): Payer: Self-pay | Admitting: Cardiovascular Disease

## 2019-09-10 DIAGNOSIS — I10 Essential (primary) hypertension: Secondary | ICD-10-CM

## 2019-12-31 ENCOUNTER — Other Ambulatory Visit (INDEPENDENT_AMBULATORY_CARE_PROVIDER_SITE_OTHER): Payer: Self-pay | Admitting: Cardiovascular Disease

## 2019-12-31 DIAGNOSIS — I10 Essential (primary) hypertension: Secondary | ICD-10-CM

## 2020-02-04 ENCOUNTER — Encounter (INDEPENDENT_AMBULATORY_CARE_PROVIDER_SITE_OTHER): Payer: Self-pay | Admitting: Cardiovascular Disease

## 2020-02-04 ENCOUNTER — Ambulatory Visit (INDEPENDENT_AMBULATORY_CARE_PROVIDER_SITE_OTHER): Payer: Medicare Other | Admitting: Cardiovascular Disease

## 2020-02-04 VITALS — BP 153/84 | HR 83 | Temp 98.0°F | Wt 224.4 lb

## 2020-02-04 DIAGNOSIS — I639 Cerebral infarction, unspecified: Secondary | ICD-10-CM

## 2020-02-04 DIAGNOSIS — I7789 Other specified disorders of arteries and arterioles: Secondary | ICD-10-CM

## 2020-02-04 DIAGNOSIS — I10 Essential (primary) hypertension: Secondary | ICD-10-CM

## 2020-02-04 NOTE — Progress Notes (Signed)
[facility] OFFICE VISIT      Chief Complaint   Patient presents with    Annual Exam       I had the pleasure of seeing Catherine Meadows today for cardiovascular follow up. She is a pleasant 79 y.o. female with a history of HTN, CVA, enlarged aorta, who presents for continued management.      She seems to be doing well from a cardiac perspective. She denies CP, SOB, palpitations. No LE edema unless sedentary. However, she has been having neck pain. Worse if she turns her head to the left. No numbness, weakness, or paresthesias in her UEs.       Echo 09/04/18 showed EF of 60%, mild AR, dilated ascending aorta measuring 42mm      MEDICATIONS:     Current Outpatient Medications   Medication Sig Dispense Refill    amLODIPine (NORVASC) 10 MG tablet Take 1 tablet (10 mg total) by mouth daily 90 tablet 1    atorvastatin (LIPITOR) 40 MG tablet Take 1 tablet (40 mg total) by mouth daily 180 tablet 1    carvedilol (COREG) 6.25 MG tablet Take 1 tablet (6.25 mg total) by mouth 2 (two) times daily with meals 90 tablet 1    clopidogrel (PLAVIX) 75 mg tablet Take 1 tablet (75 mg total) by mouth daily 180 tablet 1    hydroCHLOROthiazide (HYDRODIURIL) 25 MG tablet Take 1 tablet (25 mg total) by mouth daily 180 tablet 1    losartan (COZAAR) 100 MG tablet Take 1 tablet (100 mg total) by mouth daily 90 tablet 1     No current facility-administered medications for this visit.       REVIEW OF SYSTEMS: All other systems reviewed and negative except as above.    PHYSICAL EXAMINATION  Vital Signs: BP 153/84 (BP Site: Left arm, Patient Position: Sitting, Cuff Size: Large)    Pulse 83    Temp 98 F (36.7 C)    Wt 101.8 kg (224 lb 6.4 oz)    BMI 38.52 kg/m    Vital signs reviewed    Wt Readings from Last 3 Encounters:   02/04/20 101.8 kg (224 lb 6.4 oz)   07/06/19 102.5 kg (226 lb)   01/19/19 101.8 kg (224 lb 6.4 oz)        General Appearance:  A well-appearing female in no acute distress.    HEENT: Sclera anicteric, conjunctiva  without pallor, moist mucous membranes, normal dentition.   Neck:  Supple without jugular venous distention.  Normal carotid upstrokes without bruits.   Chest: Clear to auscultation bilaterally with good air movement and respiratory effort and no wheezes, rales, or rhonchi   Cardiac: RRR.  Normal S1 and physiologically split S2, without gallops or rub. No murmurs.   Vascular:  2+ carotid, radial pulses bilaterally  Abdomen: Soft, nontender, nondistended, with normoactive bowel sounds.  No bruits.   Extremities: Warm without edema, clubbing, or cyanosis.   Skin: No rash, warm, appropriate for race.   Neuro: Alert and oriented x3. Grossly intact.  CN II-XII intact.  Normal mood and affect.     ECG:   Independent review shows:  NSR, NS T wave changes    ASSESSMENT/PLAN:    1. HTN. Elevated today. She states her home BPs are usually 120s-130s/60s. If persistently elevated, can increase coreg.   2. Enlarged aorta. BP control and serial studies.     All patient's questions and concerns regarding cardiovascular disease were answered during this  visit.    Orders Placed This Encounter   Procedures    ECG 12 lead (Normal)    Transthoracic Echocardiogram (TTE)       Return in about 6 months (around 08/06/2020).    Governor Specking, MD  02/04/2020

## 2020-02-17 ENCOUNTER — Ambulatory Visit: Admission: RE | Admit: 2020-02-17 | Payer: Medicare (Managed Care) | Source: Ambulatory Visit

## 2020-02-25 ENCOUNTER — Other Ambulatory Visit (INDEPENDENT_AMBULATORY_CARE_PROVIDER_SITE_OTHER): Payer: Medicare (Managed Care)

## 2020-03-10 ENCOUNTER — Ambulatory Visit
Admission: RE | Admit: 2020-03-10 | Discharge: 2020-03-10 | Disposition: A | Discharge status: Home / Self Care | Payer: Medicare (Managed Care) | Source: Ambulatory Visit | Attending: Cardiovascular Disease | Admitting: Cardiovascular Disease

## 2020-03-10 ENCOUNTER — Telehealth (INDEPENDENT_AMBULATORY_CARE_PROVIDER_SITE_OTHER): Payer: Self-pay | Admitting: Cardiovascular Disease

## 2020-03-10 ENCOUNTER — Other Ambulatory Visit (INDEPENDENT_AMBULATORY_CARE_PROVIDER_SITE_OTHER): Payer: Self-pay

## 2020-03-10 DIAGNOSIS — I088 Other rheumatic multiple valve diseases: Secondary | ICD-10-CM | POA: Insufficient documentation

## 2020-03-10 DIAGNOSIS — I7789 Other specified disorders of arteries and arterioles: Secondary | ICD-10-CM | POA: Insufficient documentation

## 2020-03-10 LAB — ECHOCARDIOGRAM ADULT COMPLETE W CLR/ DOPP WAVEFORM
AV Area (Cont Eq VTI): 3.284
AV Area (Cont Eq VTI): 3.909
AV Mean Gradient: 1.998
AV Mean Gradient: 2.04
AV Mean Gradient: 2.219
AV Mean Gradient: 2.432
AV Peak Velocity: 104.278
AV Peak Velocity: 92.816
AV Peak Velocity: 94.249
AV Peak Velocity: 98.547
Ao Root Diameter (2D): 3.284
BP Mod LV Ejection Fraction: 65.62
IVS Diastolic Thickness (2D): 1.362
LA Dimension (2D): 2.657
LA Volume Index (BP A-L): 0.03
LVID diastole (2D): 3.386
LVID systole (2D): 2.149
MV Area (PHT): 3.669
MV E/A: 0.578
MV E/A: 0.631
MV E/A: 0.633
MV E/A: 0.633
MV E/A: 0.694
MV E/e' (Average): 8.341
Mitral Valve Findings: NORMAL
Pulmonary Valve Findings: NORMAL
TAPSE: 1.807
Tricuspid Valve Findings: NORMAL
Tricuspid Valve Findings: NORMAL

## 2020-03-10 NOTE — Telephone Encounter (Signed)
Being taken care of in another encounter.

## 2020-03-10 NOTE — Telephone Encounter (Signed)
Catherine Meadows     ED   03/10/20 11:57 AM  Note     Patient will need refills of all her medications and she needs refills for 6 months as she is traveling out of the country on 03[place]20[place]20.  Please sent these to the listed pharmacy.    Phone: [phone]  [facility]  Cardiac Connect              Pt last seen 02/04/20, asked to return in 6 months per   Dr. Willaim Bane.

## 2020-03-10 NOTE — Telephone Encounter (Signed)
Patient will need refills of all her medications and she needs refills for 6 months as she is traveling out of the country on [place]20.  Please sent these to the listed pharmacy.    Phone: [phone]  [facility]  Cardiac Connect

## 2020-03-13 NOTE — Telephone Encounter (Signed)
Your patient - I saw her recently because you weren't available

## 2020-03-15 ENCOUNTER — Other Ambulatory Visit (INDEPENDENT_AMBULATORY_CARE_PROVIDER_SITE_OTHER): Payer: Self-pay

## 2020-03-15 DIAGNOSIS — I10 Essential (primary) hypertension: Secondary | ICD-10-CM

## 2020-03-15 DIAGNOSIS — I7789 Other specified disorders of arteries and arterioles: Secondary | ICD-10-CM

## 2020-03-15 DIAGNOSIS — I639 Cerebral infarction, unspecified: Secondary | ICD-10-CM

## 2020-03-15 MED ORDER — AMLODIPINE BESYLATE 10 MG PO TABS
10.0000 mg | ORAL_TABLET | Freq: Every day | ORAL | 0 refills | Status: DC
Start: 2020-03-15 — End: 2020-03-18

## 2020-03-15 MED ORDER — HYDROCHLOROTHIAZIDE 25 MG PO TABS
25.0000 mg | ORAL_TABLET | Freq: Every day | ORAL | 1 refills | Status: DC
Start: 2020-03-15 — End: 2020-03-18

## 2020-03-15 MED ORDER — CLOPIDOGREL BISULFATE 75 MG PO TABS
75.0000 mg | ORAL_TABLET | Freq: Every day | ORAL | 0 refills | Status: DC
Start: 2020-03-15 — End: 2020-03-18

## 2020-03-15 MED ORDER — ATORVASTATIN CALCIUM 40 MG PO TABS
40.0000 mg | ORAL_TABLET | Freq: Every day | ORAL | 0 refills | Status: DC
Start: 2020-03-15 — End: 2020-03-18

## 2020-03-15 MED ORDER — LOSARTAN POTASSIUM 100 MG PO TABS
100.0000 mg | ORAL_TABLET | Freq: Every day | ORAL | 1 refills | Status: DC
Start: 2020-03-15 — End: 2020-03-18

## 2020-03-15 MED ORDER — CARVEDILOL 6.25 MG PO TABS
6.2500 mg | ORAL_TABLET | Freq: Two times a day (BID) | ORAL | 0 refills | Status: DC
Start: 2020-03-15 — End: 2020-03-18

## 2020-03-18 ENCOUNTER — Other Ambulatory Visit: Payer: Self-pay | Admitting: Cardiovascular Disease

## 2020-03-18 DIAGNOSIS — I639 Cerebral infarction, unspecified: Secondary | ICD-10-CM

## 2020-03-18 DIAGNOSIS — I10 Essential (primary) hypertension: Secondary | ICD-10-CM

## 2020-03-18 DIAGNOSIS — I7789 Other specified disorders of arteries and arterioles: Secondary | ICD-10-CM

## 2020-03-18 MED ORDER — LOSARTAN POTASSIUM 100 MG PO TABS
100.0000 mg | ORAL_TABLET | Freq: Every day | ORAL | 1 refills | Status: DC
Start: 2020-03-18 — End: 2020-06-16

## 2020-03-18 MED ORDER — CARVEDILOL 6.25 MG PO TABS
6.2500 mg | ORAL_TABLET | Freq: Two times a day (BID) | ORAL | 1 refills | Status: DC
Start: 2020-03-18 — End: 2020-06-16

## 2020-03-18 MED ORDER — AMLODIPINE BESYLATE 10 MG PO TABS
10.0000 mg | ORAL_TABLET | Freq: Every day | ORAL | 1 refills | Status: DC
Start: 2020-03-18 — End: 2020-06-16

## 2020-03-18 MED ORDER — HYDROCHLOROTHIAZIDE 25 MG PO TABS
25.0000 mg | ORAL_TABLET | Freq: Every day | ORAL | 1 refills | Status: DC
Start: 2020-03-18 — End: 2020-06-16

## 2020-03-18 MED ORDER — ATORVASTATIN CALCIUM 40 MG PO TABS
40.0000 mg | ORAL_TABLET | Freq: Every day | ORAL | 1 refills | Status: DC
Start: 2020-03-18 — End: 2021-01-05

## 2020-03-18 MED ORDER — CLOPIDOGREL BISULFATE 75 MG PO TABS
75.0000 mg | ORAL_TABLET | Freq: Every day | ORAL | 1 refills | Status: DC
Start: 2020-03-18 — End: 2021-01-05

## 2020-06-14 ENCOUNTER — Other Ambulatory Visit (INDEPENDENT_AMBULATORY_CARE_PROVIDER_SITE_OTHER): Payer: Self-pay | Admitting: Cardiovascular Disease

## 2020-06-14 DIAGNOSIS — I10 Essential (primary) hypertension: Secondary | ICD-10-CM

## 2020-06-16 ENCOUNTER — Telehealth (INDEPENDENT_AMBULATORY_CARE_PROVIDER_SITE_OTHER): Payer: Self-pay | Admitting: Cardiovascular Disease

## 2020-06-16 DIAGNOSIS — I10 Essential (primary) hypertension: Secondary | ICD-10-CM

## 2020-06-16 MED ORDER — HYDROCHLOROTHIAZIDE 25 MG PO TABS
25.0000 mg | ORAL_TABLET | Freq: Every day | ORAL | 1 refills | Status: DC
Start: 2020-06-16 — End: 2021-01-05

## 2020-06-16 MED ORDER — LOSARTAN POTASSIUM 100 MG PO TABS
100.0000 mg | ORAL_TABLET | Freq: Every day | ORAL | 1 refills | Status: DC
Start: 2020-06-16 — End: 2021-01-05

## 2020-06-16 MED ORDER — AMLODIPINE BESYLATE 10 MG PO TABS
10.0000 mg | ORAL_TABLET | Freq: Every day | ORAL | 1 refills | Status: DC
Start: 2020-06-16 — End: 2021-01-05

## 2020-06-16 NOTE — Telephone Encounter (Signed)
I spoke with patient's daughter. Pt c/o fatigue, presyncope November 2021. Usually occurs 3-4 hours after she took Carvedilol. pt stopped Carvedilol and went back to taking Diovan 160mg  daily. No recurrence of presyncope and fatigue.    Current BP readings are -     127/61   129/70  127/60  128/72  127/70

## 2020-06-16 NOTE — Telephone Encounter (Signed)
Informed patient's daughter that pt should stop taking Diovan and to continue to take Losartan per Dr. Willaim Bane.     Advised to check BPs and HRs and if it is higher than her usual (SBP120 mmHg), to update [place].

## 2020-06-16 NOTE — Telephone Encounter (Signed)
Pts granddaughter calling to speak with nurse regarding medication. She states that pts medication dosage was changed and since it was changed pt has been feeling fatigue and weakness and almost passed out. She would like to switch the medication back previous dosage of medication. She states she would like a call back at [place] at [phone].    [facility]  Cardiac Connect

## 2020-06-18 ENCOUNTER — Other Ambulatory Visit (INDEPENDENT_AMBULATORY_CARE_PROVIDER_SITE_OTHER): Payer: Self-pay | Admitting: Cardiovascular Disease

## 2021-01-04 ENCOUNTER — Other Ambulatory Visit (INDEPENDENT_AMBULATORY_CARE_PROVIDER_SITE_OTHER): Payer: Self-pay | Admitting: Cardiovascular Disease

## 2021-01-04 DIAGNOSIS — I7789 Other specified disorders of arteries and arterioles: Secondary | ICD-10-CM

## 2021-01-04 DIAGNOSIS — I639 Cerebral infarction, unspecified: Secondary | ICD-10-CM

## 2021-01-04 DIAGNOSIS — I10 Essential (primary) hypertension: Secondary | ICD-10-CM

## 2021-01-04 NOTE — Telephone Encounter (Signed)
Patient need a rx refill for  Amlodipine 10 mg  Atorvastan 40 mg  Carvedilol 6.25 mg  Clopidogrel 75 mg  Hydrochlorothiazide 25 mg  Losartan 100 mg    Pharmacy [facility] in [place] [phone]  Patient moved to [place]  Spoke to grand daughter [phone]    [facility]  Cardiac connect

## 2021-01-05 MED ORDER — HYDROCHLOROTHIAZIDE 25 MG PO TABS
25.0000 mg | ORAL_TABLET | Freq: Every day | ORAL | 0 refills | Status: AC
Start: 2021-01-05 — End: ?

## 2021-01-05 MED ORDER — LOSARTAN POTASSIUM 100 MG PO TABS
100.0000 mg | ORAL_TABLET | Freq: Every day | ORAL | 0 refills | Status: AC
Start: 2021-01-05 — End: ?

## 2021-01-05 MED ORDER — CARVEDILOL 6.25 MG PO TABS
ORAL_TABLET | ORAL | 0 refills | Status: AC
Start: 2021-01-05 — End: ?

## 2021-01-05 MED ORDER — ATORVASTATIN CALCIUM 40 MG PO TABS
40.0000 mg | ORAL_TABLET | Freq: Every day | ORAL | 0 refills | Status: AC
Start: 2021-01-05 — End: ?

## 2021-01-05 MED ORDER — AMLODIPINE BESYLATE 10 MG PO TABS
10.0000 mg | ORAL_TABLET | Freq: Every day | ORAL | 0 refills | Status: AC
Start: 2021-01-05 — End: ?

## 2021-01-05 MED ORDER — CLOPIDOGREL BISULFATE 75 MG PO TABS
75.0000 mg | ORAL_TABLET | Freq: Every day | ORAL | 0 refills | Status: AC
Start: 2021-01-05 — End: ?

## 2021-01-05 NOTE — Telephone Encounter (Signed)
Pt last seen 02/04/20. Pt has appointment with Dr. Willaim Bane on 01/24/21.

## 2021-01-24 ENCOUNTER — Ambulatory Visit (INDEPENDENT_AMBULATORY_CARE_PROVIDER_SITE_OTHER): Payer: Self-pay | Admitting: Cardiovascular Disease

## 2021-01-24 ENCOUNTER — Encounter (INDEPENDENT_AMBULATORY_CARE_PROVIDER_SITE_OTHER): Payer: Self-pay

## 2021-01-24 NOTE — Progress Notes (Deleted)
[facility]ICE VISIT      No chief complaint on file.      I had the pleasure of seeing Catherine Meadows today for cardiovascular follow up. She is a pleasant 80 y.o. female with a history of HTN, CVA, enlarged aorta, who presents for continued management.      She seems to be doing well from a cardiac perspective. She denies CP, SOB, palpitations. No LE edema unless sedentary. However, she has been having neck pain. Worse if she turns her head to the left. No numbness, weakness, or paresthesias in her UEs.       Echo 03/10/20 showed EF of 65-70%, mild AR    Echo 09/04/18 showed EF of 60%, mild AR, dilated ascending aorta measuring 42mm      MEDICATIONS:     Current Outpatient Medications   Medication Sig Dispense Refill    amLODIPine (NORVASC) 10 MG tablet Take 1 tablet (10 mg total) by mouth daily 90 tablet 0    atorvastatin (LIPITOR) 40 MG tablet Take 1 tablet (40 mg total) by mouth daily 90 tablet 0    carvedilol (COREG) 6.25 MG tablet TAKE 1 TABLET(6.25 MG) BY MOUTH TWICE DAILY WITH MEALS 180 tablet 0    clopidogrel (PLAVIX) 75 mg tablet Take 1 tablet (75 mg total) by mouth daily 90 tablet 0    hydroCHLOROthiazide (HYDRODIURIL) 25 MG tablet TAKE 1 TABLET BY MOUTH EVERY DAY 90 tablet 3    hydroCHLOROthiazide (HYDRODIURIL) 25 MG tablet Take 1 tablet (25 mg total) by mouth daily 90 tablet 0    losartan (COZAAR) 100 MG tablet Take 1 tablet (100 mg total) by mouth daily 90 tablet 0     No current facility-administered medications for this visit.       REVIEW OF SYSTEMS: All other systems reviewed and negative except as above.    PHYSICAL EXAMINATION  Vital Signs: There were no vitals taken for this visit.   Vital signs reviewed    Wt Readings from Last 3 Encounters:   02/04/20 101.8 kg (224 lb 6.4 oz)   07/06/19 102.5 kg (226 lb)   01/19/19 101.8 kg (224 lb 6.4 oz)        General Appearance:  A well-appearing female in no acute distress.    HEENT: Sclera anicteric, conjunctiva without pallor, moist mucous  membranes, normal dentition.   Neck:  Supple without jugular venous distention.  Normal carotid upstrokes without bruits.   Chest: Clear to auscultation bilaterally with good air movement and respiratory effort and no wheezes, rales, or rhonchi   Cardiac: RRR.  Normal S1 and physiologically split S2, without gallops or rub. No murmurs.   Vascular:  2+ carotid, radial pulses bilaterally  Abdomen: Soft, nontender, nondistended, with normoactive bowel sounds.  No bruits.   Extremities: Warm without edema, clubbing, or cyanosis.   Skin: No rash, warm, appropriate for race.   Neuro: Alert and oriented x3. Grossly intact.  CN II-XII intact.  Normal mood and affect.     ECG:   Independent review shows:  NSR, NS T wave changes    ASSESSMENT/PLAN:    HTN. Elevated today. She states her home BPs are usually 120s-130s/60s. If persistently elevated, can increase coreg.   Enlarged aorta. BP control and serial studies.     All patient's questions and concerns regarding cardiovascular disease were answered during this visit.    No orders of the defined types were placed in this encounter.  No follow-ups on file.    Governor Specking, MD  01/24/2021

## 2021-03-26 ENCOUNTER — Other Ambulatory Visit: Payer: Self-pay | Admitting: Internal Medicine

## 2021-03-26 DIAGNOSIS — Z1231 Encounter for screening mammogram for malignant neoplasm of breast: Secondary | ICD-10-CM

## 2021-04-27 ENCOUNTER — Ambulatory Visit: Payer: Self-pay

## 2021-08-17 ENCOUNTER — Other Ambulatory Visit: Payer: Self-pay | Admitting: Internal Medicine

## 2021-08-18 LAB — INFLUENZA A AND B AG, IMMUNOASSAY
INFLUENZA A ANTIGEN: NOT DETECTED
INFLUENZA B ANTIGEN: NOT DETECTED
MICRO NUMBER:: 13024537
SPECIMEN QUALITY:: ADEQUATE

## 2021-08-18 LAB — SARS-COV-2 RNA,(COVID-19) QUALITATIVE NAAT: SARS CoV2 RNA: NOT DETECTED

## 2021-08-20 LAB — URINE CULTURE
MICRO NUMBER:: 13024447
SPECIMEN QUALITY:: ADEQUATE

## 2022-01-11 ENCOUNTER — Other Ambulatory Visit: Payer: Self-pay | Admitting: Internal Medicine

## 2022-01-11 DIAGNOSIS — N644 Mastodynia: Secondary | ICD-10-CM

## 2022-01-18 ENCOUNTER — Encounter: Payer: Self-pay | Admitting: Radiology

## 2022-01-18 ENCOUNTER — Ambulatory Visit
Admission: RE | Admit: 2022-01-18 | Discharge: 2022-01-18 | Disposition: A | Discharge status: Home / Self Care | Payer: Medicare HMO | Source: Ambulatory Visit | Attending: Internal Medicine | Admitting: Internal Medicine

## 2022-01-18 DIAGNOSIS — Z1231 Encounter for screening mammogram for malignant neoplasm of breast: Secondary | ICD-10-CM

## 2022-02-19 ENCOUNTER — Ambulatory Visit
Admission: RE | Admit: 2022-02-19 | Discharge: 2022-02-19 | Disposition: A | Discharge status: Home / Self Care | Payer: Medicare HMO | Source: Ambulatory Visit | Attending: Internal Medicine | Admitting: Internal Medicine

## 2022-02-19 ENCOUNTER — Ambulatory Visit: Payer: Medicare HMO

## 2022-02-19 DIAGNOSIS — N644 Mastodynia: Secondary | ICD-10-CM

## 2022-07-29 ENCOUNTER — Encounter: Payer: Self-pay | Admitting: Internal Medicine

## 2022-07-29 ENCOUNTER — Ambulatory Visit: Payer: Medicare HMO | Admitting: Internal Medicine

## 2022-07-29 VITALS — BP 147/85 | HR 83 | Ht 65.0 in | Wt 248.0 lb

## 2022-07-29 DIAGNOSIS — R0609 Other forms of dyspnea: Secondary | ICD-10-CM

## 2022-07-29 DIAGNOSIS — R079 Chest pain, unspecified: Secondary | ICD-10-CM

## 2022-07-29 DIAGNOSIS — I10 Essential (primary) hypertension: Secondary | ICD-10-CM

## 2022-07-29 DIAGNOSIS — I209 Angina pectoris, unspecified: Secondary | ICD-10-CM

## 2022-07-29 MED ORDER — NITROGLYCERIN 0.4 MG SL SUBL: 0.4000 mg | SUBLINGUAL_TABLET | SUBLINGUAL | 3 refills | Status: AC | PRN

## 2022-07-29 NOTE — Progress Notes (Signed)
Primary Physician/Referring:  Nolene Ebbs, MD  Patient ID: [name], female    DOB: [birth year], 82 y.o.   MRN: [id number]  Chief Complaint  Patient presents with   Chest Pain   New Patient (Initial Visit)   HPI:    [name]  is a 82 y.o. female with past medical history significant for hypertension who is here to establish care with cardiology due to chest pain and dyspnea on exertion.  The symptoms are new for the patient and have developed over the last couple of months.  She says this is not always associated with activity and sometimes she gets chest pain with left arm pain and numbness when she is sitting and doing nothing.  It has been a couple of years since the patient has had a stress test or an echocardiogram.  She is traveling to [place] this upcoming Sunday and she will not be returning until May.  Patient just wants to make sure she is safe to travel.  Her blood pressure is a little bit elevated however she says it is always high when she goes to the doctor.  At home patient states her blood pressure is less than 130/80 almost all of the time.  She denies palpitations, diaphoresis, syncope, edema, orthopnea, PND, claudication.  History reviewed. No pertinent past medical history. Past Surgical History:  Procedure Laterality Date   hysterectomy     REPLACEMENT TOTAL KNEE Bilateral    ROTATOR CUFF REPAIR     Family History  Problem Relation Age of Onset   Hypertension Mother     Social History   Tobacco Use   Smoking status: Never   Smokeless tobacco: Never  Substance Use Topics   Alcohol use: Never   Marital Status: Married  ROS  Review of Systems  Cardiovascular:  Positive for chest pain and dyspnea on exertion.   Objective  Blood pressure (!) 147/85, pulse 83, height 5\' 5"  (1.651 m), weight 248 lb (112.5 kg), SpO2 98 %. Body mass index is 41.27 kg/m.     07/29/2022   10:04 AM 07/29/2022    9:56 AM  Vitals with BMI  Height  5\' 5"   Weight  248 lbs   BMI  44.96  Systolic 759 163  Diastolic 85 94  Pulse 83 85     Physical Exam Vitals reviewed.  HENT:     Head: Normocephalic and atraumatic.  Cardiovascular:     Rate and Rhythm: Normal rate and regular rhythm.     Pulses: Normal pulses.     Heart sounds: Murmur heard.  Pulmonary:     Effort: Pulmonary effort is normal.     Breath sounds: Normal breath sounds.  Abdominal:     General: Bowel sounds are normal.  Musculoskeletal:     Right lower leg: No edema.     Left lower leg: No edema.  Skin:    General: Skin is warm and dry.  Neurological:     Mental Status: She is alert.    Medications and allergies   Allergies  Allergen Reactions   Tramadol Nausea And Vomiting    `     Medication list after today's encounter   Current Outpatient Medications:    amLODipine (NORVASC) 10 MG tablet, Take 10 mg by mouth daily., Disp: , Rfl:    aspirin EC 81 MG tablet, Take 81 mg by mouth daily. Swallow whole., Disp: , Rfl:    atorvastatin (LIPITOR) 40 MG tablet, Take 40 mg  by mouth daily., Disp: , Rfl:    carvedilol (COREG) 6.25 MG tablet, Take 6.25 mg by mouth 2 (two) times daily., Disp: , Rfl:    Difluprednate 0.05 % EMUL, SMARTSIG:1 Right Eye 4 Times Daily, Disp: , Rfl:    gatifloxacin (ZYMAXID) 0.5 % SOLN, Place 1 drop into the left eye 4 (four) times daily., Disp: , Rfl:    hydrochlorothiazide (HYDRODIURIL) 25 MG tablet, Take 25 mg by mouth daily., Disp: , Rfl:    ketorolac (ACULAR) 0.5 % ophthalmic solution, Place 1 drop into the left eye 4 (four) times daily., Disp: , Rfl:    losartan (COZAAR) 100 MG tablet, Take 100 mg by mouth daily., Disp: , Rfl:    moxifloxacin (VIGAMOX) 0.5 % ophthalmic solution, Place 1 drop into the right eye 4 (four) times daily., Disp: , Rfl:    nitroGLYCERIN (NITROSTAT) 0.4 MG SL tablet, Place 1 tablet (0.4 mg total) under the tongue every 5 (five) minutes as needed for chest pain., Disp: 90 tablet, Rfl: 3   PROLENSA 0.07 % SOLN, Place 1 drop into  the right eye at bedtime., Disp: , Rfl:    tiZANidine (ZANAFLEX) 2 MG tablet, Take 2 mg by mouth at bedtime., Disp: , Rfl:   Laboratory examination:   Lab Results  Component Value Date   NA 139 11/18/2007   K 3.8 11/18/2007   CO2 31 11/18/2007   GLUCOSE 107 (H) 11/18/2007   BUN 19 11/18/2007   CREATININE 0.98 11/18/2007   CALCIUM 9.3 11/18/2007   GFRNONAA 57 (L) 11/18/2007       11/18/2007   12:43 PM  CMP  Glucose 107   BUN 19   Creatinine 0.98   Sodium 139   Potassium 3.8   Chloride 102   CO2 31   Calcium 9.3       11/19/2007   11:34 AM  CBC  Hemoglobin 13.5 POINT OF CARE RESULT     Lipid Panel No results for input(s): "CHOL", "TRIG", "LDLCALC", "VLDL", "HDL", "CHOLHDL", "LDLDIRECT" in the last 8760 hours.  HEMOGLOBIN A1C No results found for: "HGBA1C", "MPG" TSH No results for input(s): "TSH" in the last 8760 hours.  External labs:  Labs 01/11/2022: Total cholesterol 143 triglycerides 69 HDL 61 LDL 68 Hemoglobin 11.8 hematocrit 38.2 platelets 222 BUN 18 creatinine 1.05 potassium 4.6 TSH 3.04   Radiology:    Cardiac Studies:     EKG:   07/29/2022: Sinus Rhythm -Nonspecific T-abnormality.  Assessment     ICD-10-CM   1. Chest pain of uncertain etiology  R07.9 EKG 12-Lead    PCV ECHOCARDIOGRAM COMPLETE    PCV MYOCARDIAL PERFUSION WO LEXISCAN    2. 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 Essential hypertension  I10        Orders Placed This Encounter  Procedures   PCV MYOCARDIAL PERFUSION WO LEXISCAN    Standing Status:   Future    Standing Expiration Date:   09/27/2022   EKG 12-Lead   PCV ECHOCARDIOGRAM COMPLETE    Standing Status:   Future    Standing Expiration Date:   07/30/2023    Meds ordered this encounter  Medications   nitroGLYCERIN (NITROSTAT) 0.4 MG SL  tablet    Sig: Place 1 tablet (0.4 mg total) under the tongue every 5 (  five) minutes as needed for chest pain.    Dispense:  90 tablet    Refill:  3    There are no discontinued medications.   Recommendations:   [name] is a 82 y.o.  female with chest pain   DOE (dyspnea on exertion) Will obtain nuclear stress test tomorrow as patient is going out of the country SL nitro sent for DOE and chest pain Patient instructed not to do heavy lifting, heavy exertional activity, swimming until evaluation is complete.  Patient instructed to call if symptoms worse or to go to the ED for further evaluation.   Angina pectoris ([place]) Stress test and echo ordered Will likely get echo once back from [place] as she is leaving this Sunday and we may not have appointment available Continue baby aspirin, statin, and coreg   Essential hypertension Patient states her BP at home is <130/80 but she is nervous at the doctor's office Follow-up in May once patient returns from [place]     Sakari Raisanen, [place], [facility]  07/29/2022, 10:51 AM Office: [phone] Pager: [phone]

## 2022-07-30 ENCOUNTER — Ambulatory Visit: Payer: Medicare HMO

## 2022-07-30 DIAGNOSIS — R079 Chest pain, unspecified: Secondary | ICD-10-CM

## 2022-07-30 DIAGNOSIS — R0609 Other forms of dyspnea: Secondary | ICD-10-CM

## 2022-07-30 DIAGNOSIS — I209 Angina pectoris, unspecified: Secondary | ICD-10-CM

## 2022-08-02 ENCOUNTER — Ambulatory Visit: Payer: Medicare HMO

## 2022-08-02 DIAGNOSIS — R079 Chest pain, unspecified: Secondary | ICD-10-CM

## 2022-08-02 DIAGNOSIS — I209 Angina pectoris, unspecified: Secondary | ICD-10-CM

## 2022-08-02 DIAGNOSIS — R0609 Other forms of dyspnea: Secondary | ICD-10-CM

## 2022-08-02 NOTE — Progress Notes (Signed)
normal

## 2022-08-02 NOTE — Progress Notes (Signed)
Called patient, left results on VM.

## 2022-08-05 NOTE — Progress Notes (Signed)
Called patient no answer could not leave a vm

## 2022-08-07 NOTE — Progress Notes (Signed)
LMTCB

## 2022-08-07 NOTE — Progress Notes (Signed)
Called patient, NA, left echocardiogram results  and confirmed FU appointment on VM.

## 2022-12-02 ENCOUNTER — Ambulatory Visit: Payer: Medicare HMO | Admitting: Internal Medicine

## 2022-12-02 ENCOUNTER — Encounter: Payer: Self-pay | Admitting: Internal Medicine

## 2022-12-02 VITALS — BP 144/76 | HR 81 | Ht 65.0 in | Wt 240.0 lb

## 2022-12-02 DIAGNOSIS — E782 Mixed hyperlipidemia: Secondary | ICD-10-CM

## 2022-12-02 DIAGNOSIS — I77810 Thoracic aortic ectasia: Secondary | ICD-10-CM

## 2022-12-02 DIAGNOSIS — I10 Essential (primary) hypertension: Secondary | ICD-10-CM

## 2022-12-02 NOTE — Progress Notes (Signed)
Primary Physician/Referring:  Fleet Contras, MD  Patient ID: [name], female    DOB: [birth year], 82 y.o.   MRN: [id number]  Chief Complaint  Patient presents with   Chest Pain   Follow-up   HPI:    [name]  is a 82 y.o. female with past medical history significant for hypertension who is here for a follow-up visit. She recently visited her family in [place] and she had a very good trip. Her stress test and echocardiogram are within normal limits and she is very happy about that. Overall, she has been feeling well. Her blood pressure is a little bit elevated today. At home patient states her blood pressure is less than 130/80 almost all of the time except when she eats something salty. She admits recently she has been eating more salt than usual. We discussed the importance of adhering to a low salt diet. She denies chest pain, shortness of breath, palpitations, diaphoresis, syncope, edema, orthopnea, PND, claudication.  History reviewed. No pertinent past medical history. Past Surgical History:  Procedure Laterality Date   hysterectomy     REPLACEMENT TOTAL KNEE Bilateral    ROTATOR CUFF REPAIR     Family History  Problem Relation Age of Onset   Hypertension Mother     Social History   Tobacco Use   Smoking status: Never   Smokeless tobacco: Never  Substance Use Topics   Alcohol use: Never   Marital Status: Married  ROS  Review of Systems  Cardiovascular:  Negative for chest pain, dyspnea on exertion, leg swelling and palpitations.   Objective  Blood pressure (!) 144/76, pulse 81, height 5\' 5"  (1.651 m), weight 240 lb (108.9 kg), SpO2 98 %. Body mass index is 39.94 kg/m.     12/02/2022    8:59 AM 12/02/2022    8:49 AM 07/29/2022   10:04 AM  Vitals with BMI  Height  5\' 5"    Weight  240 lbs   BMI  39.94   Systolic 144 148 409  Diastolic 76 80 85  Pulse 81 75 83     Physical Exam Vitals reviewed.  HENT:     Head: Normocephalic and atraumatic.   Cardiovascular:     Rate and Rhythm: Normal rate and regular rhythm.     Pulses: Normal pulses.     Heart sounds: Murmur heard.  Pulmonary:     Effort: Pulmonary effort is normal.     Breath sounds: Normal breath sounds.  Abdominal:     General: Bowel sounds are normal.  Musculoskeletal:     Right lower leg: No edema.     Left lower leg: No edema.  Skin:    General: Skin is warm and dry.  Neurological:     Mental Status: She is alert.     Medications and allergies   Allergies  Allergen Reactions   Tramadol Nausea And Vomiting    `     Medication list after today's encounter   Current Outpatient Medications:    amLODipine (NORVASC) 10 MG tablet, Take 10 mg by mouth daily., Disp: , Rfl:    aspirin EC 81 MG tablet, Take 81 mg by mouth daily. Swallow whole., Disp: , Rfl:    atorvastatin (LIPITOR) 40 MG tablet, Take 40 mg by mouth daily., Disp: , Rfl:    carvedilol (COREG) 6.25 MG tablet, Take 6.25 mg by mouth 2 (two) times daily., Disp: , Rfl:    Difluprednate 0.05 % EMUL, SMARTSIG:1 Right Eye  4 Times Daily, Disp: , Rfl:    gatifloxacin (ZYMAXID) 0.5 % SOLN, Place 1 drop into the left eye 4 (four) times daily., Disp: , Rfl:    hydrochlorothiazide (HYDRODIURIL) 25 MG tablet, Take 25 mg by mouth daily., Disp: , Rfl:    ketorolac (ACULAR) 0.5 % ophthalmic solution, Place 1 drop into the left eye 4 (four) times daily., Disp: , Rfl:    losartan (COZAAR) 100 MG tablet, Take 100 mg by mouth daily., Disp: , Rfl:    moxifloxacin (VIGAMOX) 0.5 % ophthalmic solution, Place 1 drop into the right eye 4 (four) times daily., Disp: , Rfl:    nitroGLYCERIN (NITROSTAT) 0.4 MG SL tablet, Place 1 tablet (0.4 mg total) under the tongue every 5 (five) minutes as needed for chest pain., Disp: 90 tablet, Rfl: 3   PROLENSA 0.07 % SOLN, Place 1 drop into the right eye at bedtime., Disp: , Rfl:    tiZANidine (ZANAFLEX) 2 MG tablet, Take 2 mg by mouth at bedtime., Disp: , Rfl:   Laboratory examination:    Lab Results  Component Value Date   NA 139 11/18/2007   K 3.8 11/18/2007   CO2 31 11/18/2007   GLUCOSE 107 (H) 11/18/2007   BUN 19 11/18/2007   CREATININE 0.98 11/18/2007   CALCIUM 9.3 11/18/2007   GFRNONAA 57 (L) 11/18/2007       11/18/2007   12:43 PM  CMP  Glucose 107   BUN 19   Creatinine 0.98   Sodium 139   Potassium 3.8   Chloride 102   CO2 31   Calcium 9.3       11/19/2007   11:34 AM  CBC  Hemoglobin 13.5 POINT OF CARE RESULT     Lipid Panel No results for input(s): "CHOL", "TRIG", "LDLCALC", "VLDL", "HDL", "CHOLHDL", "LDLDIRECT" in the last 8760 hours.  HEMOGLOBIN A1C No results found for: "HGBA1C", "MPG" TSH No results for input(s): "TSH" in the last 8760 hours.  External labs:  Labs 01/11/2022: Total cholesterol 143 triglycerides 69 HDL 61 LDL 68 Hemoglobin 11.8 hematocrit 38.2 platelets 222 BUN 18 creatinine 1.05 potassium 4.6 TSH 3.04   Radiology:    Cardiac Studies:   Echocardiogram 08/02/2022: Normal LV systolic function with visual EF 55-60%. Left ventricle cavity is normal in size. Mild concentric hypertrophy of the left ventricle. Normal global wall motion. Doppler evidence of grade I (impaired) diastolic dysfunction, normal LAP. Calculated EF 63%. Trileaflet aortic valve.  Trace aortic regurgitation. Mild aortic valve leaflet calcification. Structurally normal mitral valve.  Mild (Grade I) mitral regurgitation. Structurally normal tricuspid valve.  Mild tricuspid regurgitation. Mild pulmonary hypertension. RVSP measures 30 mmHg. The aortic root is normal. 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 No prior available for comparison.    Regadenoson (with Mod Bruce protocol) Nuclear stress test 07/30/2022 Myocardial perfusion is normal. Overall LV systolic function is normal without regional wall motion abnormalities. Stress LV EF: 75%. Low risk study. Nondiagnostic ECG stress. The heart rate response was consistent with Regadenoson.  The blood pressure response was physiologic. No previous exam available for comparison.   EKG:   07/29/2022: Sinus Rhythm -Nonspecific T-abnormality.  Assessment     ICD-10-CM   1. Essential hypertension  I10     2. 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   No orders of the defined types were  placed in this encounter.   There are no discontinued medications.   Recommendations:   ELISABETH BEN is a 82 y.o.  female with hypertension and hyperlipidemia   Dilated ascending aorta Echo 08/02/2022 shows mildly dilated ascending aorta at 4.2 cm. Repeat echocardiogram in 1 year Stress test was normal without ischemia  Hyperlipidemia Continue lipitor 40 mg PCP is managing lipids  Essential hypertension Continue current cardiac medications. Encourage low-sodium diet, less than 2000 mg daily. She does admit to eating more salt recently and she notices when she does not eat salt her blood pressure is actually <130/80 Follow-up in 6 months or sooner if needed.     Clotilde Dieter, DO, [facility]  12/02/2022, 1:27 PM Office: [phone] Pager: [phone]

## 2023-03-26 ENCOUNTER — Other Ambulatory Visit: Payer: Self-pay | Admitting: Internal Medicine

## 2023-03-26 DIAGNOSIS — Z0000 Encounter for general adult medical examination without abnormal findings: Secondary | ICD-10-CM

## 2023-03-28 ENCOUNTER — Ambulatory Visit
Admission: RE | Admit: 2023-03-28 | Discharge: 2023-03-28 | Disposition: A | Discharge status: Home / Self Care | Payer: Medicare HMO | Source: Ambulatory Visit | Attending: Internal Medicine | Admitting: Internal Medicine

## 2023-03-28 DIAGNOSIS — Z0000 Encounter for general adult medical examination without abnormal findings: Secondary | ICD-10-CM

## 2023-04-01 ENCOUNTER — Other Ambulatory Visit (INDEPENDENT_AMBULATORY_CARE_PROVIDER_SITE_OTHER): Payer: Medicare HMO

## 2023-04-01 ENCOUNTER — Encounter: Payer: Self-pay | Admitting: Physician Assistant

## 2023-04-01 ENCOUNTER — Ambulatory Visit (INDEPENDENT_AMBULATORY_CARE_PROVIDER_SITE_OTHER): Payer: Medicare HMO | Admitting: Physician Assistant

## 2023-04-01 DIAGNOSIS — M25512 Pain in left shoulder: Secondary | ICD-10-CM

## 2023-04-01 DIAGNOSIS — M542 Cervicalgia: Secondary | ICD-10-CM

## 2023-04-01 DIAGNOSIS — G8929 Other chronic pain: Secondary | ICD-10-CM

## 2023-04-01 NOTE — Progress Notes (Signed)
Office Visit Note   Patient: [name]           Date of Birth: December 04, 1940           MRN: [id number] Visit Date: 04/01/2023              Requested by: Fleet Contras, MD [address] [place],  [place] [address] PCP: Fleet Contras, MD   Assessment & Plan: Visit Diagnoses:  1. Neck pain   2. Chronic left shoulder pain     Plan: Pleasant 82 year old woman with a 4 to 52-month history of neck pain and stiffness that runs down her arm with some associated paresthesias in her hand.  Also some shoulder pain.  She is status post shoulder arthroscopy subacromial decompression rotator cuff repair with Dr. Cleophas Dunker several years ago describes her pain is moderate.  She has tried some Tylenol without much success.  Based on her exam today she has both shoulder and neck symptoms however I think she is more problematic in her neck.  She has already tried a steroid taper as well as a steroid injection.  Helped a little bit also taking a muscle relaxant.  I would like to go forward and further have her work with physical therapy on both her left shoulder and her cervical spine and follow-up with me in a month.  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  By exam she is more symptomatic in the neck.  If she returns in a month and still has symptoms I would recommend an MRI of her cervical spine  Follow-Up Instructions: No follow-ups on file.   Orders:  Orders Placed This Encounter  Procedures   XR Cervical Spine 2 or 3 views   XR Shoulder Left   No orders of the defined types were placed in this encounter.     Procedures: No procedures performed   Clinical Data: No additional findings.   Subjective: Chief Complaint  Patient presents with   Neck - Pain   Left Shoulder - Pain    HPI patient is a 82 year old woman who is a previous patient of Dr. Cleophas Dunker.  She has a history of a left shoulder arthroscopy subacromial decompression rotator cuff repair in 2012.  She comes in  today complaining of left shoulder to hand pain with associated paresthesias.  She thinks it starts in the neck.  No particular injury rates her pain is moderate  Review of Systems  All other systems reviewed and are negative.    Objective: Vital Signs: There were no vitals taken for this visit.  Physical Exam Constitutional:      Appearance: Normal appearance.  Pulmonary:     Effort: Pulmonary effort is normal.  Skin:    General: Skin is warm and dry.  Neurological:     General: No focal deficit present.     Mental Status: She is alert.  Psychiatric:        Mood and Affect: Mood normal.        Behavior: Behavior normal.    Ortho Exam Examination of her neck she has good forward flexion some limited extension and reproduction of her symptoms with turning her head to the left not so much the right she has good strength with triceps biceps abduction strength and grip strength.  Turning her head does reproduce her radicular symptoms.  She does not really have any impingement findings today just has some pain with lifting her arm overhead.  She actually has fairly good  motion externally and does seem to reproduce any pain in the glenohumeral joint she is neurovascularly intact Specialty Comments:  No specialty comments available.  Imaging: No results found.   PMFS History: Patient Active Problem List   Diagnosis Date Noted   Neck pain 04/01/2023   Pain in left shoulder 04/01/2023   History reviewed. No pertinent past medical history.  Family History  Problem Relation Age of Onset   Hypertension Mother     Past Surgical History:  Procedure Laterality Date   hysterectomy     REPLACEMENT TOTAL KNEE Bilateral    ROTATOR CUFF REPAIR     Social History   Occupational History   Not on file  Tobacco Use   Smoking status: Never   Smokeless tobacco: Never  Vaping Use   Vaping status: Never Used  Substance and Sexual Activity   Alcohol use: Never   Drug use: Never    Sexual activity: Not on file

## 2023-04-10 ENCOUNTER — Encounter: Payer: Self-pay | Admitting: Rehabilitative and Restorative Service Providers"

## 2023-04-10 ENCOUNTER — Ambulatory Visit: Payer: Medicare HMO | Admitting: Rehabilitative and Restorative Service Providers"

## 2023-04-10 DIAGNOSIS — R293 Abnormal posture: Secondary | ICD-10-CM | POA: Diagnosis not present

## 2023-04-10 DIAGNOSIS — M542 Cervicalgia: Secondary | ICD-10-CM | POA: Diagnosis not present

## 2023-04-10 DIAGNOSIS — M6281 Muscle weakness (generalized): Secondary | ICD-10-CM

## 2023-04-10 DIAGNOSIS — M5412 Radiculopathy, cervical region: Secondary | ICD-10-CM

## 2023-04-10 NOTE — Therapy (Signed)
OUTPATIENT PHYSICAL THERAPY CERVICAL & SHOULDER EVALUATION  Referring diagnosis? Diagnosis M54.2 (ICD-10-CM) - Neck pain M25.512,G89.29 (ICD-10-CM) - Chronic left shoulder pain   Treatment diagnosis? (if different than referring diagnosis) R29.3   M62.81   M54.2   M54.12 What was this (referring dx) caused by? []  Surgery []  Fall []  Ongoing issue [x]  Arthritis []  Other: ____________  Laterality: []  Rt [x]  Lt []  Both  Check all possible CPT codes:  *CHOOSE 10 OR LESS*    [x]  97110 (Therapeutic Exercise)  []  92507 (SLP Treatment)  [x]  97112 (Neuro Re-ed)   []  92526 (Swallowing Treatment)   [x]  16109 (Gait Training)   []  [phone] (Cognitive Training, 1st 15 minutes) [x]  97140 (Manual Therapy)   []  97130 (Cognitive Training, each add'l 15 minutes)  []  97164 (Re-evaluation)                              []  Other, List CPT Code ____________  [x]  97530 (Therapeutic Activities)     [x]  97535 (Self Care)   [x]  All codes above (97110 - 97535)  [x]  97012 (Mechanical Traction)  []  97014 (E-stim Unattended)  []  97032 (E-stim manual)  []  97033 (Ionto)  []  09811 (Ultrasound) []  97750 (Physical Performance Training) []  [id number] (Aquatic Therapy) []  97016 (Vasopneumatic Device) []  [id number] (Paraffin) []  97034 (Contrast Bath) []  97597 (Wound Care 1st 20 sq cm) []  97598 (Wound Care each add'l 20 sq cm) []  97760 (Orthotic Fabrication, Fitting, Training Initial) []  [id number] (Prosthetic Management and Training Initial) []  [id number] (Orthotic or Prosthetic Training/ Modification Subsequent)  Patient Name: VERTA RIEDLINGER MRN: [id number] DOB:10-13-1940, 82 y.o., female Today's Date: 04/10/2023  END OF SESSION:  PT End of Session - 04/10/23 1525     Visit Number 1    Number of Visits 14    Date for PT Re-Evaluation 06/05/23    Authorization Type [employer]    Authorization - Number of Visits 12    Progress Note Due on Visit 10    PT Start Time 1345    PT Stop Time 1430    PT Time Calculation (min) 45 min     Activity Tolerance Patient tolerated treatment well;No increased pain    Behavior During Therapy [facility] for tasks assessed/performed             History reviewed. No pertinent past medical history. Past Surgical History:  Procedure Laterality Date   hysterectomy     REPLACEMENT TOTAL KNEE Bilateral    ROTATOR CUFF REPAIR     Patient Active Problem List   Diagnosis Date Noted   Neck pain 04/01/2023   Pain in left shoulder 04/01/2023    PCP: Fleet Contras, MD  REFERRING PROVIDER: West Bali Persons, PA  REFERRING DIAG: Diagnosis M54.2 (ICD-10-CM) - Neck pain M25.512,G89.29 (ICD-10-CM) - Chronic left shoulder pain  THERAPY DIAG:  Abnormal posture - Plan: PT plan of care cert/re-cert  Muscle weakness (generalized) - Plan: PT plan of care cert/re-cert  Cervicalgia - Plan: PT plan of care cert/re-cert  Radiculopathy, cervical region - Plan: PT plan of care cert/re-cert  Rationale for Evaluation and Treatment: Rehabilitation  ONSET DATE: May of 2024 the radicular stuff started.    SUBJECTIVE:  SUBJECTIVE STATEMENT: Doyce had a left RTC repair in 2009.  She notes cervical spine, scapular and left arm pain and tingling into the fingers that started in May of this year.  Symptoms have not been improving.  Hand dominance: Right  PERTINENT HISTORY: B TKA, rotator cuff repair  PAIN:  Are you having pain? Yes: NPRS scale: 3-5/10 on a 10/10 Pain location: Neck, left shoulder blade, left arm Pain description: Ache sore, tingling and numbness Aggravating factors: Lie down Relieving factors: Lift left arm over head  PRECAUTIONS: Cervical  RED FLAGS: None   WEIGHT BEARING RESTRICTIONS: No  FALLS:  Has patient fallen in last 6 months? No  LIVING ENVIRONMENT: Lives with: lives with their family  and lives with their son Lives in: House/apartment Stairs:  She manages Has following equipment at home: Single point cane  OCCUPATION: Retired  PLOF: Independent with household mobility with device  PATIENT GOALS:Get rid of scapular and arm symptoms  NEXT MD VISIT:   OBJECTIVE:  Note: Objective measures were completed at Evaluation unless otherwise noted.  DIAGNOSTIC FINDINGS:  Radiographs of her cervical spine do not show any acute injury she does  have multilevel degenerative changes with joint space narrowing and  sclerotic changes she has loss of normal lordotic curve  Radiographs of the left shoulder were reviewed today no acute fractures  noted she does have significant degenerative changes with sclerotic and  cystic changes of the humeral head and osteophyte formation.   PATIENT SURVEYS:  FOTO 41 (Goal 47 in 14 visits)  COGNITION: Overall cognitive status: Within functional limits for tasks assessed     SENSATION: Notes left scapular and arm symptoms to the hand  POSTURE: Significant for forward head, IR and protracted shoulders and decreased lumbar lordosis  UPPER EXTREMITY ROM:   Active ROM Left/Right 04/10/2023   Shoulder flexion    Shoulder extension    Shoulder abduction    Shoulder adduction    Shoulder internal rotation    Shoulder external rotation    Elbow flexion    Elbow extension    Wrist flexion    Wrist extension    Wrist ulnar deviation    Wrist radial deviation    Wrist pronation    Wrist supination    Cervical rotation 20/30   Cervical lateral bending 25/10   Cervical extension  25   (Blank rows = not tested)  STRENGTH:  Assesed with hand-held dynamometer Left/Right 04/10/2023   Shoulder flexion    Shoulder extension    Shoulder abduction    Shoulder adduction    Shoulder internal rotation    Shoulder external rotation    Middle trapezius    Lower trapezius    Elbow flexion    Elbow extension    Wrist flexion    Wrist  extension    Wrist ulnar deviation    Wrist radial deviation    Wrist pronation    Wrist supination    Grip strength (lbs)    Cervical extension 14.8 pounds   Cervical lateral bending 8.6/13.6 pounds   (Blank rows = not tested)    TODAY'S TREATMENT:  DATE: 04/10/2023 Scapular retraction/shoulder blade pinches (SBP) 10 x 5 seconds Cervical extension isometrics 10 x 5 seconds Cervical rotation AROM (SBP 1st) 10 x 5 seconds  Functional Activities: Reviewed imaging and spine anatomy with spine model, discussed the importance of posture and frequent change of position throughout the day   PATIENT EDUCATION: Education details: See above Person educated: Patient Education method: Explanation, Demonstration, Tactile cues, Verbal cues, and Handouts Education comprehension: verbalized understanding, returned demonstration, verbal cues required, tactile cues required, and needs further education  HOME EXERCISE PROGRAM: Access Code: [email or web address] URL: [email or web address] Date: 04/10/2023 Prepared by: Pauletta Browns  Exercises - Standing Scapular Retraction  - 5 x daily - 7 x weekly - 1 sets - 5 reps - 5 second hold - Seated Cervical Rotation AROM  - 3 x daily - 7 x weekly - 1 sets - 10 reps - 5 seconds hold - Standing Isometric Cervical Extension with Manual Resistance  - 5 x daily - 7 x weekly - 1 sets - 5 reps - 5 hold  ASSESSMENT:  CLINICAL IMPRESSION: Patient is a 82 y.o. female who was seen today for physical therapy evaluation and treatment for Diagnosis M54.2 (ICD-10-CM) - Neck pain M25.512,G89.29 (ICD-10-CM) - Chronic left shoulder pain.  Symptoms appear to be more related to her cervical radiculopathy although a shoulder screen will be completed at her next visit to rule out a second problem.  Josanne has postural, cervical AROM, cervical,  postural and scapular strength impairments that will benefit from the recommended course of physical therapy.  OBJECTIVE IMPAIRMENTS: Abnormal gait, decreased activity tolerance, decreased endurance, decreased knowledge of condition, difficulty walking, decreased ROM, decreased strength, decreased safety awareness, impaired perceived functional ability, impaired sensation, impaired UE functional use, improper body mechanics, postural dysfunction, and pain.   ACTIVITY LIMITATIONS: carrying, lifting, sitting, standing, squatting, reach over head, and locomotion level  PARTICIPATION LIMITATIONS: community activity  PERSONAL FACTORS: B TKA, rotator cuff repair are also affecting patient's functional outcome.   REHAB POTENTIAL: Good  CLINICAL DECISION MAKING: Stable/uncomplicated  EVALUATION COMPLEXITY: Low   GOALS: Goals reviewed with patient? Yes  SHORT TERM GOALS: Target date: 05/08/2023  Sulamita will be independent with her day 1 home exercise program Baseline: Started 04/10/2023 Goal status: INITIAL  2.  Improve cervical AROM for extension to 45 degrees; right lateral bending to 20 degrees and bilateral rotation to 40 degrees Baseline: 25; 10 and 20/30 degrees respectively Goal status: INITIAL  3.  Improve cervical extension strength to at least 25 pounds Baseline: 14.8 pounds Goal status: INITIAL  LONG TERM GOALS: Target date: 06/05/2023  Improve FOTO to 47 in 14 visits Baseline: 41 Goal status: INITIAL  2.  Semira will report cervical pain consistently 0-4/10 on the numeric pain rating scale with no complaints of scapular or left upper extremity pain Baseline: Pain as high as 5/10 for the cervical spine, left scapula and left upper extremity Goal status: INITIAL  3.  Improve cervical extension strength to at least 35 pounds Baseline: 14.8 pounds Goal status: INITIAL  4.  Naomia will be independent with her long-term home maintenance exercise program at discharge Baseline:  Started 04/10/2023 Goal status: INITIAL  5.  Makenzee will have an improved postural awareness and will be able to implement this into static and dynamic activities Baseline: Flexed posture Goal status: INITIAL  PLAN:  PT FREQUENCY: 1-2x/week  PT DURATION: 8 weeks  PLANNED INTERVENTIONS: 97110-Therapeutic exercises, 97530- Therapeutic activity, [id number]- Neuromuscular re-education, 97535- Self Care, 01027- Manual therapy, 25366- Traction (  mechanical), Patient/Family education, Dry Needling, Spinal mobilization, Cryotherapy, and Moist heat  PLAN FOR NEXT SESSION: Quick shoulder screen as we did not have time at evaluation, review day 1 home exercise program, progress cervical, postural and scapular strength to address radicular symptoms.  Consider traction in a week or 2 if no change in the severity or if no centralization of her symptoms.   Cherlyn Cushing, PT, MPT 04/10/2023, 3:46 PM

## 2023-04-17 ENCOUNTER — Ambulatory Visit: Payer: Medicare HMO | Admitting: Physical Therapy

## 2023-04-17 ENCOUNTER — Encounter: Payer: Self-pay | Admitting: Physical Therapy

## 2023-04-17 DIAGNOSIS — M542 Cervicalgia: Secondary | ICD-10-CM | POA: Diagnosis not present

## 2023-04-17 DIAGNOSIS — M6281 Muscle weakness (generalized): Secondary | ICD-10-CM | POA: Diagnosis not present

## 2023-04-17 DIAGNOSIS — R293 Abnormal posture: Secondary | ICD-10-CM

## 2023-04-17 NOTE — Therapy (Signed)
OUTPATIENT PHYSICAL THERAPY TREATMENT NOTE   Patient Name: [name] MRN: [id number] DOB:04-Sep-1940, 82 y.o., female Today's Date: 04/17/2023  END OF SESSION:  PT End of Session - 04/17/23 1431     Visit Number 2    Number of Visits 14    Date for PT Re-Evaluation 06/05/23    Authorization Type [employer]    Authorization Time Period 12 PT VISITS 10/10-12/5    Authorization - Visit Number 2    Authorization - Number of Visits 12    Progress Note Due on Visit 10    PT Start Time 1431    PT Stop Time 1511    PT Time Calculation (min) 40 min    Activity Tolerance Patient tolerated treatment well;No increased pain    Behavior During Therapy [facility] for tasks assessed/performed              History reviewed. No pertinent past medical history. Past Surgical History:  Procedure Laterality Date   hysterectomy     REPLACEMENT TOTAL KNEE Bilateral    ROTATOR CUFF REPAIR     Patient Active Problem List   Diagnosis Date Noted   Neck pain 04/01/2023   Pain in left shoulder 04/01/2023    PCP: Fleet Contras, MD  REFERRING PROVIDER: [place] Persons, PA  REFERRING DIAG: Diagnosis M54.2 (ICD-10-CM) - Neck pain M25.512,G89.29 (ICD-10-CM) - Chronic left shoulder pain  THERAPY DIAG:  Abnormal posture  Muscle weakness (generalized)  Cervicalgia  Rationale for Evaluation and Treatment: Rehabilitation  ONSET DATE: May of 2024 the radicular stuff started.    SUBJECTIVE:                                                                                                                                                                                     Per eval [name] had a left RTC repair in 2009.  She notes cervical spine, scapular and left arm pain and tingling into the fingers that started in May of this year.  Symptoms have not been improving.  Hand dominance: Right  SUBJECTIVE STATEMENT: 04/17/2023 Pt states exercises seem helpful since last session, have been doing daily.  4/10 pain at present, N/T has improved and is not present currently.    PERTINENT HISTORY: B TKA, rotator cuff repair  PAIN:  Are you having pain? Yes: NPRS scale: 3-5/10 on a 10/10 Pain location: Neck, left shoulder blade, left arm Pain description: Ache sore, tingling and numbness Aggravating factors: Lie down Relieving factors: Lift left arm over head  PRECAUTIONS: Cervical  RED FLAGS: None   WEIGHT BEARING RESTRICTIONS: No  FALLS:  Has patient fallen in last 6 months? No  LIVING ENVIRONMENT: Lives with: lives with their family and lives with their son Lives in: House/apartment Stairs:  She manages Has following equipment at home: Single point cane  OCCUPATION: Retired  PLOF: Independent with household mobility with device  PATIENT GOALS:Get rid of scapular and arm symptoms  NEXT MD VISIT:   OBJECTIVE:  Note: Objective measures were completed at Evaluation unless otherwise noted.  DIAGNOSTIC FINDINGS:  Radiographs of her cervical spine do not show any acute injury she does  have multilevel degenerative changes with joint space narrowing and  sclerotic changes she has loss of normal lordotic curve  Radiographs of the left shoulder were reviewed today no acute fractures  noted she does have significant degenerative changes with sclerotic and  cystic changes of the humeral head and osteophyte formation.   PATIENT SURVEYS:  FOTO 41 (Goal 47 in 14 visits)  COGNITION: Overall cognitive status: Within functional limits for tasks assessed     SENSATION: Notes left scapular and arm symptoms to the hand  POSTURE: Significant for forward head, IR and protracted shoulders and decreased lumbar lordosis  UPPER EXTREMITY ROM:   Active ROM Left/Right 04/10/2023 R/L 04/17/23  Shoulder flexion  154 deg / 138 deg both painless  Shoulder extension    Shoulder abduction  142 deg / 108 deg stiff on L, mild pain and does reproduce tingling in arm   Shoulder adduction     Shoulder internal rotation    Shoulder external rotation    Elbow flexion    Elbow extension    Wrist flexion    Wrist extension    Wrist ulnar deviation    Wrist radial deviation    Wrist pronation    Wrist supination    Cervical rotation 20/30 36/28  Cervical lateral bending 25/10   Cervical extension  25   (Blank rows = not tested)  STRENGTH:  Assesed with hand-held dynamometer Left/Right 04/10/2023   Shoulder flexion    Shoulder extension    Shoulder abduction    Shoulder adduction    Shoulder internal rotation    Shoulder external rotation    Middle trapezius    Lower trapezius    Elbow flexion    Elbow extension    Wrist flexion    Wrist extension    Wrist ulnar deviation    Wrist radial deviation    Wrist pronation    Wrist supination    Grip strength (lbs)    Cervical extension 14.8 pounds   Cervical lateral bending 8.6/13.6 pounds   (Blank rows = not tested)    TODAY'S TREATMENT:                                                                                                                                         [facility] Adult PT Treatment:  DATE: 04/17/23 Therapeutic Exercise: Scapular retraction 2x12 w/ 5 sec hold cues for form and reduced UT compensations, breath control Cervical extension isometrics x8 5 sec hold Cervical rotation x10 BIL with cues for posture and comfortable ROM Shoulder abduction isometric LUE 2x5 with 3 sec hold cues for setup and form Shoulder extension isometric LUE 2x5 with 3 sec hold Double ER + scap retraction unresisted 2x8 cues for posture and reduced UT compensations Seated chin tuck 2x8 cues for reduced compensations HEP update + education/handout    DATE: 04/10/2023 Scapular retraction/shoulder blade pinches (SBP) 10 x 5 seconds Cervical extension isometrics 10 x 5 seconds Cervical rotation AROM (SBP 1st) 10 x 5 seconds  Functional Activities: Reviewed imaging and spine  anatomy with spine model, discussed the importance of posture and frequent change of position throughout the day   PATIENT EDUCATION: Education details: See above Person educated: Patient Education method: Explanation, Demonstration, Tactile cues, Verbal cues, and Handouts Education comprehension: verbalized understanding, returned demonstration, verbal cues required, tactile cues required, and needs further education  HOME EXERCISE PROGRAM: Access Code: [email or web address] URL: [email or web address] Date: 04/17/2023 Prepared by: Fransisco Hertz  Exercises - Standing Scapular Retraction  - 5 x daily - 7 x weekly - 1 sets - 5 reps - 5 second hold - Seated Cervical Rotation AROM  - 3 x daily - 7 x weekly - 1 sets - 10 reps - 5 seconds hold - Standing Isometric Cervical Extension with Manual Resistance  - 5 x daily - 7 x weekly - 1 sets - 5 reps - 5 hold - Shoulder External Rotation and Scapular Retraction  - 3-4 x daily - 7 x weekly - 1 sets - 8 reps - Standing Isometric Shoulder Abduction with Doorway - Arm Bent  - 3-4 x daily - 7 x weekly - 1 sets - 5 reps - 3sec hold  ASSESSMENT:  CLINICAL IMPRESSION: 04/17/2023 Pt arrives w/ 4/10 pain and notes improved symptoms w/ HEP performance. Shoulder screen is provocative today for reproduction of UE tingling, noted improvement in cervical rotation ROM. Able to progress volume with postural extension based exercises and isometrics, addition of GH isometrics to address shoulder deficits. Pt tolerates well without adverse event, no increase in resting pain although she does endorse mild fatigue/soreness as expected on departure. Recommend continuing along current POC in order to address relevant deficits and improve functional tolerance. Pt departs today's session in no acute distress, all voiced questions/concerns addressed appropriately from PT perspective.    Per eval - Patient is a 82 y.o. female who was seen today for physical therapy  evaluation and treatment for Diagnosis M54.2 (ICD-10-CM) - Neck pain M25.512,G89.29 (ICD-10-CM) - Chronic left shoulder pain.  Symptoms appear to be more related to her cervical radiculopathy although a shoulder screen will be completed at her next visit to rule out a second problem.  [name] has postural, cervical AROM, cervical, postural and scapular strength impairments that will benefit from the recommended course of physical therapy.  OBJECTIVE IMPAIRMENTS: Abnormal gait, decreased activity tolerance, decreased endurance, decreased knowledge of condition, difficulty walking, decreased ROM, decreased strength, decreased safety awareness, impaired perceived functional ability, impaired sensation, impaired UE functional use, improper body mechanics, postural dysfunction, and pain.   ACTIVITY LIMITATIONS: carrying, lifting, sitting, standing, squatting, reach over head, and locomotion level  PARTICIPATION LIMITATIONS: community activity  PERSONAL FACTORS: B TKA, rotator cuff repair are also affecting patient's functional outcome.   REHAB POTENTIAL: Good  CLINICAL DECISION MAKING: Stable/uncomplicated  EVALUATION COMPLEXITY: Low  GOALS: Goals reviewed with patient? Yes  SHORT TERM GOALS: Target date: 05/08/2023  [name] will be independent with her day 1 home exercise program Baseline: Started 04/10/2023 Goal status: INITIAL  2.  Improve cervical AROM for extension to 45 degrees; right lateral bending to 20 degrees and bilateral rotation to 40 degrees Baseline: 25; 10 and 20/30 degrees respectively Goal status: INITIAL  3.  Improve cervical extension strength to at least 25 pounds Baseline: 14.8 pounds Goal status: INITIAL  LONG TERM GOALS: Target date: 06/05/2023  Improve FOTO to 47 in 14 visits Baseline: 41 Goal status: INITIAL  2.  [name] will report cervical pain consistently 0-4/10 on the numeric pain rating scale with no complaints of scapular or left upper extremity  pain Baseline: Pain as high as 5/10 for the cervical spine, left scapula and left upper extremity Goal status: INITIAL  3.  Improve cervical extension strength to at least 35 pounds Baseline: 14.8 pounds Goal status: INITIAL  4.  [name] will be independent with her long-term home maintenance exercise program at discharge Baseline: Started 04/10/2023 Goal status: INITIAL  5.  [name] will have an improved postural awareness and will be able to implement this into static and dynamic activities Baseline: Flexed posture Goal status: INITIAL  PLAN:  PT FREQUENCY: 1-2x/week  PT DURATION: 8 weeks  PLANNED INTERVENTIONS: 97110-Therapeutic exercises, 97530- Therapeutic activity, 97112- Neuromuscular re-education, 97535- Self Care, 96295- Manual therapy, 97012- Traction (mechanical), Patient/Family education, Dry Needling, Spinal mobilization, Cryotherapy, and Moist heat  PLAN FOR NEXT SESSION: continue working into more postural extension as able/tolerated. Incorporating cervical, periscapular, and GH stability as shoulder abduction does provoke UE symptoms    Ashley Murrain PT, DPT 04/17/2023 3:14 PM

## 2023-04-29 ENCOUNTER — Encounter: Payer: Self-pay | Admitting: Physician Assistant

## 2023-04-29 ENCOUNTER — Ambulatory Visit: Payer: Medicare HMO | Admitting: Physician Assistant

## 2023-04-29 DIAGNOSIS — M25512 Pain in left shoulder: Secondary | ICD-10-CM

## 2023-04-29 DIAGNOSIS — M542 Cervicalgia: Secondary | ICD-10-CM

## 2023-04-29 NOTE — Progress Notes (Signed)
   Office Visit Note   Patient: [name]           Date of Birth: 1940-12-28           MRN: [id number] Visit Date: 04/29/2023              Requested by: Fleet Contras, MD [address] [place],  [place] [address] PCP: Fleet Contras, MD  No chief complaint on file.     HPI: [name] is a pleasant 82 year old woman with a history of cervical neck and left shoulder pain.  She has arthritis of both.  At her last visit she was willing to try some physical therapy.  She has only had 2 sessions and has at least 4 more planned.  She reports she is doing better.  Her pain is more mild to moderate  Assessment & Plan: Visit Diagnoses:  1. Acute pain of left shoulder   2. Neck pain     Plan: I would like her to complete physical therapy.  Certainly if her symptoms get worse or she does not get better she can contact me I would order an MRI most likely of her cervical spine to see if an injection would be helpful  Follow-Up Instructions: No follow-ups on file.   Ortho Exam  Patient is alert, oriented, no adenopathy, well-dressed, normal affect, normal respiratory effort. Examination of her left shoulder she has active forward elevation to about 160 degrees she can internally rotate behind her back grip strength is intact she has slight altered sensation.  Brisk capillary refill compartments are soft and compressible no real tenderness over the spine or step-offs.  Imaging: No results found. No images are attached to the encounter.  Labs: No results found for: "HGBA1C", "ESRSEDRATE", "CRP", "LABURIC", "REPTSTATUS", "GRAMSTAIN", "CULT", "LABORGA"   No results found for: "ALBUMIN", "PREALBUMIN", "CBC"  No results found for: "MG" No results found for: "VD25OH"  No results found for: "PREALBUMIN"    11/19/2007   11:34 AM  CBC EXTENDED  Hemoglobin 13.5 POINT OF CARE RESULT      There is no height or weight on file to calculate BMI.  Orders:  No orders of the defined types  were placed in this encounter.  No orders of the defined types were placed in this encounter.    Procedures: No procedures performed  Clinical Data: No additional findings.  ROS:  All other systems negative, except as noted in the HPI. Review of Systems  Objective: Vital Signs: There were no vitals taken for this visit.  Specialty Comments:  No specialty comments available.  PMFS History: Patient Active Problem List   Diagnosis Date Noted   Neck pain 04/01/2023   Pain in left shoulder 04/01/2023   History reviewed. No pertinent past medical history.  Family History  Problem Relation Age of Onset   Hypertension Mother     Past Surgical History:  Procedure Laterality Date   hysterectomy     REPLACEMENT TOTAL KNEE Bilateral    ROTATOR CUFF REPAIR     Social History   Occupational History   Not on file  Tobacco Use   Smoking status: Never   Smokeless tobacco: Never  Vaping Use   Vaping status: Never Used  Substance and Sexual Activity   Alcohol use: Never   Drug use: Never   Sexual activity: Not on file

## 2023-04-30 ENCOUNTER — Ambulatory Visit: Payer: Medicare HMO | Admitting: Rehabilitative and Restorative Service Providers"

## 2023-04-30 ENCOUNTER — Encounter: Payer: Self-pay | Admitting: Rehabilitative and Restorative Service Providers"

## 2023-04-30 DIAGNOSIS — R293 Abnormal posture: Secondary | ICD-10-CM

## 2023-04-30 DIAGNOSIS — M5412 Radiculopathy, cervical region: Secondary | ICD-10-CM

## 2023-04-30 DIAGNOSIS — M542 Cervicalgia: Secondary | ICD-10-CM

## 2023-04-30 DIAGNOSIS — M6281 Muscle weakness (generalized): Secondary | ICD-10-CM | POA: Diagnosis not present

## 2023-04-30 NOTE — Therapy (Signed)
OUTPATIENT PHYSICAL THERAPY TREATMENT NOTE   Patient Name: [name] MRN: [id number] DOB:01-31-1941, 82 y.o., female Today's Date: 04/30/2023  END OF SESSION:  PT End of Session - 04/30/23 1430     Visit Number 3    Number of Visits 14    Date for PT Re-Evaluation 06/05/23    Authorization Type [employer]    Authorization Time Period 12 PT VISITS 10/10-12/5    Authorization - Visit Number 3    Authorization - Number of Visits 12    Progress Note Due on Visit 10    PT Start Time 1430    PT Stop Time 1515    PT Time Calculation (min) 45 min    Activity Tolerance Patient tolerated treatment well;No increased pain    Behavior During Therapy [facility] for tasks assessed/performed               History reviewed. No pertinent past medical history. Past Surgical History:  Procedure Laterality Date   hysterectomy     REPLACEMENT TOTAL KNEE Bilateral    ROTATOR CUFF REPAIR     Patient Active Problem List   Diagnosis Date Noted   Neck pain 04/01/2023   Pain in left shoulder 04/01/2023    PCP: Fleet Contras, MD  REFERRING PROVIDER: [place] Bali Persons, PA  REFERRING DIAG: Diagnosis M54.2 (ICD-10-CM) - Neck pain M25.512,G89.29 (ICD-10-CM) - Chronic left shoulder pain  THERAPY DIAG:  Abnormal posture  Muscle weakness (generalized)  Cervicalgia  Radiculopathy, cervical region  Rationale for Evaluation and Treatment: Rehabilitation  ONSET DATE: May of 2024 the radicular stuff started.    SUBJECTIVE:                                                                                                                                                                                     [name] notes less severe upper extremity symptoms and they are not as frequent, although symptoms can still be present as distal as the hand.  Per eval [name] had a left RTC repair in 2009.  She notes cervical spine, scapular and left arm pain and tingling into the fingers that started in May of this  year.  Symptoms have not been improving.  Hand dominance: Right  SUBJECTIVE STATEMENT: 04/30/2023 [name] notes she has had to take tylenol 2 x in the last week.  0-4/10 over the past week.     PERTINENT HISTORY: B TKA, rotator cuff repair  PAIN:  Are you having pain? Yes: NPRS scale: 0-4/10 (was 3-5/10) on a 10/10 Pain location: Neck, left shoulder blade, left arm Pain description: Ache sore, tingling and numbness Aggravating factors: Lie down, doing better at  night but tries to avoid lying on the left side Relieving factors: Lift left arm over head  PRECAUTIONS: Cervical  RED FLAGS: None   WEIGHT BEARING RESTRICTIONS: No  FALLS:  Has patient fallen in last 6 months? No  LIVING ENVIRONMENT: Lives with: lives with their family and lives with their son Lives in: House/apartment Stairs:  She manages Has following equipment at home: Single point cane  OCCUPATION: Retired  PLOF: Independent with household mobility with device  PATIENT GOALS:Get rid of scapular and arm symptoms  NEXT MD VISIT:   OBJECTIVE:  Note: Objective measures were completed at Evaluation unless otherwise noted.  DIAGNOSTIC FINDINGS:  Radiographs of her cervical spine do not show any acute injury she does  have multilevel degenerative changes with joint space narrowing and  sclerotic changes she has loss of normal lordotic curve  Radiographs of the left shoulder were reviewed today no acute fractures  noted she does have significant degenerative changes with sclerotic and  cystic changes of the humeral head and osteophyte formation.   PATIENT SURVEYS:  05/2023 FOTO Next visit  Evaluation FOTO 41 (Goal 47 in 14 visits)  COGNITION: Overall cognitive status: Within functional limits for tasks assessed     SENSATION: Notes left scapular and arm symptoms to the hand  POSTURE: Significant for forward head, IR and protracted shoulders and decreased lumbar lordosis  UPPER EXTREMITY ROM:    Active ROM Left/Right 04/10/2023 R/L 04/17/23 Left/Right   Shoulder flexion  154 deg / 138 deg both painless   Shoulder extension     Shoulder abduction  142 deg / 108 deg stiff on L, mild pain and does reproduce tingling in arm    Shoulder adduction     Shoulder internal rotation     Shoulder external rotation     Elbow flexion     Elbow extension     Wrist flexion     Wrist extension     Wrist ulnar deviation     Wrist radial deviation     Wrist pronation     Wrist supination     Cervical rotation 20/30 36/28 30/45   Cervical lateral bending 25/10  20/0  Cervical extension  25  30  (Blank rows = not tested)  STRENGTH:  Assesed with hand-held dynamometer Left/Right 04/10/2023 Left/Right 04/30/2023  Shoulder flexion    Shoulder extension    Shoulder abduction    Shoulder adduction    Shoulder internal rotation    Shoulder external rotation    Middle trapezius    Lower trapezius    Elbow flexion    Elbow extension    Wrist flexion    Wrist extension    Wrist ulnar deviation    Wrist radial deviation    Wrist pronation    Wrist supination    Grip strength (lbs)    Cervical extension 14.8 pounds 20.9 pounds  Cervical lateral bending 8.6/13.6 pounds 16.6/17.9 pounds  (Blank rows = not tested)    TODAY'S TREATMENT:  [facility] Adult PT Treatment:                                                DATE: 04/30/2023 Scapular retraction/shoulder blade pinches (SBP) 10 x 5 seconds Cervical extension isometrics 10 x 5 seconds Cervical rotation AROM (SBP 1st) 10 x 5 second Rows with blue theraband 10 x 5 seconds Cervical lateral bending AROM 10 x 5 seconds  Functional Activities: Postural review and reassessment measure review   04/17/23 Therapeutic Exercise: Scapular retraction 2x12 w/ 5 sec hold cues for form and reduced UT compensations,  breath control Cervical extension isometrics x8 5 sec hold Cervical rotation x10 BIL with cues for posture and comfortable ROM Shoulder abduction isometric LUE 2x5 with 3 sec hold cues for setup and form Shoulder extension isometric LUE 2x5 with 3 sec hold Double ER + scap retraction unresisted 2x8 cues for posture and reduced UT compensations Seated chin tuck 2x8 cues for reduced compensations HEP update + education/handout    DATE: 04/10/2023 Scapular retraction/shoulder blade pinches (SBP) 10 x 5 seconds Cervical extension isometrics 10 x 5 seconds Cervical rotation AROM (SBP 1st) 10 x 5 seconds  Functional Activities: Reviewed imaging and spine anatomy with spine model, discussed the importance of posture and frequent change of position throughout the day   PATIENT EDUCATION: Education details: See above Person educated: Patient Education method: Explanation, Demonstration, Tactile cues, Verbal cues, and Handouts Education comprehension: verbalized understanding, returned demonstration, verbal cues required, tactile cues required, and needs further education  HOME EXERCISE PROGRAM: Access Code: [email or web address] URL: [email or web address] Date: 04/17/2023 Prepared by: Fransisco Hertz  Exercises - Standing Scapular Retraction  - 5 x daily - 7 x weekly - 1 sets - 5 reps - 5 second hold - Seated Cervical Rotation AROM  - 3 x daily - 7 x weekly - 1 sets - 10 reps - 5 seconds hold - Standing Isometric Cervical Extension with Manual Resistance  - 5 x daily - 7 x weekly - 1 sets - 5 reps - 5 hold - Shoulder External Rotation and Scapular Retraction  - 3-4 x daily - 7 x weekly - 1 sets - 8 reps - Standing Isometric Shoulder Abduction with Doorway - Arm Bent  - 3-4 x daily - 7 x weekly - 1 sets - 5 reps - 3sec hold  ASSESSMENT:  CLINICAL IMPRESSION: 04/30/2023 [name] notes less frequent and severe left upper extremity symptoms since starting PT 20 days ago.  Objective measures  for cervical AROM and strength have improved and are still shy of expected values.  With continued PT, HEP compliance and posture/body mechanics work, [name] should meet all long-term goals established at evaluation.   Per eval - Patient is a 82 y.o. female who was seen today for physical therapy evaluation and treatment for Diagnosis M54.2 (ICD-10-CM) - Neck pain M25.512,G89.29 (ICD-10-CM) - Chronic left shoulder pain.  Symptoms appear to be more related to her cervical radiculopathy although a shoulder screen will be completed at her next visit to rule out a second problem.  [name] has postural, cervical AROM, cervical, postural and scapular strength impairments that will benefit from the recommended course of physical therapy.  OBJECTIVE IMPAIRMENTS: Abnormal gait, decreased activity tolerance, decreased endurance, decreased knowledge of condition, difficulty walking, decreased ROM, decreased strength, decreased safety awareness, impaired perceived functional ability, impaired sensation, impaired UE functional use,  improper body mechanics, postural dysfunction, and pain.   ACTIVITY LIMITATIONS: carrying, lifting, sitting, standing, squatting, reach over head, and locomotion level  PARTICIPATION LIMITATIONS: community activity  PERSONAL FACTORS: B TKA, rotator cuff repair are also affecting patient's functional outcome.   REHAB POTENTIAL: Good  CLINICAL DECISION MAKING: Stable/uncomplicated  EVALUATION COMPLEXITY: Low   GOALS: Goals reviewed with patient? Yes  SHORT TERM GOALS: Target date: 05/08/2023  [name] will be independent with her day 1 home exercise program Baseline: Started 04/10/2023 Goal status: Met 04/30/2023  2.  Improve cervical AROM for extension to 45 degrees; right lateral bending to 20 degrees and bilateral rotation to 40 degrees Baseline: 25; 10 and 20/30 degrees respectively Goal status: On Going 04/30/2023  3.  Improve cervical extension strength to at least 25  pounds Baseline: 14.8 pounds Goal status: On Going 04/30/2023  LONG TERM GOALS: Target date: 06/05/2023  Improve FOTO to 47 in 14 visits Baseline: 41 Goal status: INITIAL  2.  [name] will report cervical pain consistently 0-4/10 on the numeric pain rating scale with no complaints of scapular or left upper extremity pain Baseline: Pain as high as 5/10 for the cervical spine, left scapula and left upper extremity Goal status: Improved, On Going 04/30/2023  3.  Improve cervical extension strength to at least 35 pounds Baseline: 14.8 pounds Goal status: INITIAL  4.  [name] will be independent with her long-term home maintenance exercise program at discharge Baseline: Started 04/10/2023 Goal status: INITIAL  5.  [name] will have an improved postural awareness and will be able to implement this into static and dynamic activities Baseline: Flexed posture Goal status: INITIAL  PLAN:  PT FREQUENCY: 1-2x/week  PT DURATION: 8 weeks  PLANNED INTERVENTIONS: 97110-Therapeutic exercises, 97530- Therapeutic activity, 97112- Neuromuscular re-education, 97535- Self Care, 19147- Manual therapy, 97012- Traction (mechanical), Patient/Family education, Dry Needling, Spinal mobilization, Cryotherapy, and Moist heat  PLAN FOR NEXT SESSION: Continue current plan of care with additional scapular and cervical strength progressions as appropriate.   Cherlyn Cushing PT, MPT 04/30/2023 5:19 PM

## 2023-05-07 ENCOUNTER — Ambulatory Visit: Payer: Medicare HMO | Admitting: Rehabilitative and Restorative Service Providers"

## 2023-05-07 ENCOUNTER — Encounter: Payer: Self-pay | Admitting: Rehabilitative and Restorative Service Providers"

## 2023-05-07 DIAGNOSIS — M542 Cervicalgia: Secondary | ICD-10-CM

## 2023-05-07 DIAGNOSIS — M5412 Radiculopathy, cervical region: Secondary | ICD-10-CM | POA: Diagnosis not present

## 2023-05-07 DIAGNOSIS — M6281 Muscle weakness (generalized): Secondary | ICD-10-CM

## 2023-05-07 DIAGNOSIS — R293 Abnormal posture: Secondary | ICD-10-CM | POA: Diagnosis not present

## 2023-05-07 NOTE — Therapy (Signed)
OUTPATIENT PHYSICAL THERAPY TREATMENT NOTE   Patient Name: [name] MRN: [id number] DOB:[birth year], 82 y.o., female Today's Date: 05/07/2023  END OF SESSION:  PT End of Session - 05/07/23 1428     Visit Number 4    Number of Visits 14    Date for PT Re-Evaluation 06/05/23    Authorization Type [employer]    Authorization Time Period 12 PT VISITS 10/10-12/5    Authorization - Visit Number 4    Authorization - Number of Visits 12    Progress Note Due on Visit 10    PT Start Time 1427    PT Stop Time 1514    PT Time Calculation (min) 47 min    Activity Tolerance Patient tolerated treatment well;No increased pain    Behavior During Therapy [facility] for tasks assessed/performed                History reviewed. No pertinent past medical history. Past Surgical History:  Procedure Laterality Date   hysterectomy     REPLACEMENT TOTAL KNEE Bilateral    ROTATOR CUFF REPAIR     Patient Active Problem List   Diagnosis Date Noted   Neck pain 04/01/2023   Pain in left shoulder 04/01/2023    PCP: Fleet Contras, MD  REFERRING PROVIDER: [place] B[place] Persons, PA  REFERRING DIAG: Diagnosis M54.2 (ICD-10-CM) - Neck pain M25.512,G89.29 (ICD-10-CM) - Chronic left shoulder pain  THERAPY DIAG:  Abnormal posture  Muscle weakness (generalized)  Cervicalgia  Radiculopathy, cervical region  Rationale for Evaluation and Treatment: Rehabilitation  ONSET DATE: May of 2024 the radicular stuff started.    SUBJECTIVE:                                                                                                                                                                                     Meleah notes overall progress with her (less severe) upper extremity symptoms since starting PT.  Symptoms are not as frequent, although they can still be present as distal as the hand.  Per eval [name] had a left RTC repair in 2009.  She notes cervical spine, scapular and left arm pain and tingling  into the fingers that started in May of this year.  Symptoms have not been improving.  Hand dominance: Right  SUBJECTIVE STATEMENT: 05/07/2023 [name] notes she had to take tylenol and the muscle relaxer 1 x this week (2 x in the previous week).  0-4/10 over the past week.  She can now lie on the left side.     PERTINENT HISTORY: B TKA, rotator cuff repair  PAIN:  Are you having pain? Yes: NPRS scale: 0-4/10 (was 3-5/10) on  a 10/10 Pain location: Neck, left shoulder blade, left arm Pain description: Ache sore, tingling and numbness Aggravating factors: Lie down, doing better at night but tries to avoid lying on the left side Relieving factors: Lift left arm over head  PRECAUTIONS: Cervical  RED FLAGS: None   WEIGHT BEARING RESTRICTIONS: No  FALLS:  Has patient fallen in last 6 months? No  LIVING ENVIRONMENT: Lives with: lives with their family and lives with their son Lives in: House/apartment Stairs:  She manages Has following equipment at home: Single point cane  OCCUPATION: Retired  PLOF: Independent with household mobility with device  PATIENT GOALS:Get rid of scapular and arm symptoms  NEXT MD VISIT:   OBJECTIVE:  Note: Objective measures were completed at Evaluation unless otherwise noted.  DIAGNOSTIC FINDINGS:  Radiographs of her cervical spine do not show any acute injury she does  have multilevel degenerative changes with joint space narrowing and  sclerotic changes she has loss of normal lordotic curve  Radiographs of the left shoulder were reviewed today no acute fractures  noted she does have significant degenerative changes with sclerotic and  cystic changes of the humeral head and osteophyte formation.   PATIENT SURVEYS:  05/2023 FOTO Next visit!  Evaluation FOTO 41 (Goal 47 in 14 visits)  COGNITION: Overall cognitive status: Within functional limits for tasks assessed     SENSATION: Notes left scapular and arm symptoms to the  hand  POSTURE: Significant for forward head, IR and protracted shoulders and decreased lumbar lordosis  UPPER EXTREMITY ROM:   Active ROM Left/Right 04/10/2023 R/L 04/17/23 Left/Right 05/07/2023  Shoulder flexion  154 deg / 138 deg both painless 150/160  Shoulder extension     Shoulder abduction  142 deg / 108 deg stiff on L, mild pain and does reproduce tingling in arm    Shoulder adduction     Shoulder internal rotation   45/70  Shoulder external rotation   80/90  Elbow flexion     Elbow extension     Wrist flexion     Wrist extension     Wrist ulnar deviation     Wrist radial deviation     Wrist pronation     Wrist supination     Cervical rotation 20/30 36/28 30/45   Cervical lateral bending 25/10  20/0  Cervical extension  25  30  (Blank rows = not tested)  STRENGTH:  Assesed with hand-held dynamometer Left/Right 04/10/2023 Left/Right 04/30/2023 Left/Right 05/07/2023  Shoulder flexion     Shoulder extension     Shoulder abduction     Shoulder adduction     Shoulder internal rotation   28.3/34.0  Shoulder external rotation   15.6/18.3  Middle trapezius     Lower trapezius     Elbow flexion     Elbow extension     Wrist flexion     Wrist extension     Wrist ulnar deviation     Wrist radial deviation     Wrist pronation     Wrist supination     Grip strength (lbs)     Cervical extension 14.8 pounds 20.9 pounds   Cervical lateral bending 8.6/13.6 pounds 16.6/17.9 pounds   (Blank rows = not tested)    TODAY'S TREATMENT:  [facility] Adult PT Treatment:                                                DATE:  05/07/2023 Scapular retraction/shoulder blade pinches (SBP) 10 x 5 seconds Cervical extension isometrics 10 x 5 seconds Cervical rotation AROM (SBP 1st) 10 x 5 second Rows with blue Thera-Band 10 x 5 seconds Shoulder extension with Red  Thera-Band 10 x 3 seconds Shoulder ER with Thera-Band Green 10 x 3 seconds Cervical lateral bending AROM 10 x 5 seconds  Functional Activities: Reviewed spine model (anatomy) and shoulder model (anatomy of impingement)   04/30/2023 Scapular retraction/shoulder blade pinches (SBP) 10 x 5 seconds Cervical extension isometrics 10 x 5 seconds Cervical rotation AROM (SBP 1st) 10 x 5 second Rows with blue theraband 10 x 5 seconds Cervical lateral bending AROM 10 x 5 seconds  Functional Activities: Postural review and reassessment measure review   04/17/23 Therapeutic Exercise: Scapular retraction 2x12 w/ 5 sec hold cues for form and reduced UT compensations, breath control Cervical extension isometrics x8 5 sec hold Cervical rotation x10 BIL with cues for posture and comfortable ROM Shoulder abduction isometric LUE 2x5 with 3 sec hold cues for setup and form Shoulder extension isometric LUE 2x5 with 3 sec hold Double ER + scap retraction unresisted 2x8 cues for posture and reduced UT compensations Seated chin tuck 2x8 cues for reduced compensations HEP update + education/handout    PATIENT EDUCATION: Education details: See above Person educated: Patient Education method: Explanation, Demonstration, Tactile cues, Verbal cues, and Handouts Education comprehension: verbalized understanding, returned demonstration, verbal cues required, tactile cues required, and needs further education  HOME EXERCISE PROGRAM: Access Code: [email or web address] URL: [email or web address] Date: 04/17/2023 Prepared by: [name]  Exercises - Standing Scapular Retraction  - 5 x daily - 7 x weekly - 1 sets - 5 reps - 5 second hold - Seated Cervical Rotation AROM  - 3 x daily - 7 x weekly - 1 sets - 10 reps - 5 seconds hold - Standing Isometric Cervical Extension with Manual Resistance  - 5 x daily - 7 x weekly - 1 sets - 5 reps - 5 hold - Shoulder External Rotation and Scapular Retraction  - 3-4 x  daily - 7 x weekly - 1 sets - 8 reps - Standing Isometric Shoulder Abduction with Doorway - Arm Bent  - 3-4 x daily - 7 x weekly - 1 sets - 5 reps - 3sec hold  ASSESSMENT:  CLINICAL IMPRESSION: 05/07/2023 [name] notes less frequent and severe left upper extremity symptoms since starting PT.  Objective measures for cervical AROM and strength have improved and will benefit from continued work.  Although her problem is mostly cervical nerve root, Phoebie does have some shoulder impingement that will need to be addressed as well for the best outcome.  With continued PT, HEP compliance and posture/body mechanics work, Shante should meet all long-term goals established at evaluation.   Per eval - Patient is a 82 y.o. female who was seen today for physical therapy evaluation and treatment for Diagnosis M54.2 (ICD-10-CM) - Neck pain M25.512,G89.29 (ICD-10-CM) - Chronic left shoulder pain.  Symptoms appear to be more related to her cervical radiculopathy although a shoulder screen will be completed at her next visit to rule out a second problem.  Arlynn has postural, cervical AROM, cervical, postural and  scapular strength impairments that will benefit from the recommended course of physical therapy.  OBJECTIVE IMPAIRMENTS: Abnormal gait, decreased activity tolerance, decreased endurance, decreased knowledge of condition, difficulty walking, decreased ROM, decreased strength, decreased safety awareness, impaired perceived functional ability, impaired sensation, impaired UE functional use, improper body mechanics, postural dysfunction, and pain.   ACTIVITY LIMITATIONS: carrying, lifting, sitting, standing, squatting, reach over head, and locomotion level  PARTICIPATION LIMITATIONS: community activity  PERSONAL FACTORS: B TKA, rotator cuff repair are also affecting patient's functional outcome.   REHAB POTENTIAL: Good  CLINICAL DECISION MAKING: Stable/uncomplicated  EVALUATION COMPLEXITY: Low   GOALS: Goals  reviewed with patient? Yes  SHORT TERM GOALS: Target date: 05/08/2023  Denisia will be independent with her day 1 home exercise program Baseline: Started 04/10/2023 Goal status: Met 04/30/2023  2.  Improve cervical AROM for extension to 45 degrees; right lateral bending to 20 degrees and bilateral rotation to 40 degrees Baseline: 25; 10 and 20/30 degrees respectively Goal status: On Going 04/30/2023  3.  Improve cervical extension strength to at least 25 pounds Baseline: 14.8 pounds Goal status: On Going 04/30/2023  LONG TERM GOALS: Target date: 06/05/2023  Improve FOTO to 47 in 14 visits Baseline: 41 Goal status: INITIAL  2.  Farzana will report cervical pain consistently 0-4/10 on the numeric pain rating scale with no complaints of scapular or left upper extremity pain Baseline: Pain as high as 5/10 for the cervical spine, left scapula and left upper extremity Goal status: Improved, On Going 05/07/2023  3.  Improve cervical extension strength to at least 35 pounds Baseline: 14.8 pounds Goal status: INITIAL  4.  Keegan will be independent with her long-term home maintenance exercise program at discharge Baseline: Started 04/10/2023 Goal status: INITIAL  5.  Keashia will have an improved postural awareness and will be able to implement this into static and dynamic activities Baseline: Flexed posture Goal status: On Going 05/07/2023  PLAN:  PT FREQUENCY: 1-2x/week  PT DURATION: 8 weeks  PLANNED INTERVENTIONS: 97110-Therapeutic exercises, 97530- Therapeutic activity, 97112- Neuromuscular re-education, 97535- Self Care, 29562- Manual therapy, 97012- Traction (mechanical), Patient/Family education, Dry Needling, Spinal mobilization, Cryotherapy, and Moist heat  PLAN FOR NEXT SESSION: Scapular protraction and IR stretch.  Reassess cervical, FOTO and progress note.   Cherlyn Cushing PT, MPT 05/07/2023 5:05 PM

## 2023-05-14 ENCOUNTER — Ambulatory Visit: Payer: Medicare HMO | Admitting: Rehabilitative and Restorative Service Providers"

## 2023-05-14 ENCOUNTER — Encounter: Payer: Self-pay | Admitting: Rehabilitative and Restorative Service Providers"

## 2023-05-14 DIAGNOSIS — R293 Abnormal posture: Secondary | ICD-10-CM

## 2023-05-14 DIAGNOSIS — M5412 Radiculopathy, cervical region: Secondary | ICD-10-CM | POA: Diagnosis not present

## 2023-05-14 DIAGNOSIS — M542 Cervicalgia: Secondary | ICD-10-CM | POA: Diagnosis not present

## 2023-05-14 DIAGNOSIS — M6281 Muscle weakness (generalized): Secondary | ICD-10-CM | POA: Diagnosis not present

## 2023-05-14 NOTE — Therapy (Signed)
OUTPATIENT PHYSICAL THERAPY TREATMENT/DISCHARGE NOTE  PHYSICAL THERAPY DISCHARGE SUMMARY  Visits from Start of Care: 5  Current functional level related to goals / functional outcomes: See note   Remaining deficits: See note   Education / Equipment: Updated home exercise program  Patient agrees to discharge. Patient goals were  mostly met . Patient is being discharged due to being pleased with the current functional level.   Patient Name: [name] MRN: [id number] DOB:01/03/1941, 82 y.o., female Today's Date: 05/14/2023  END OF SESSION:  PT End of Session - 05/14/23 1429     Visit Number 5    Number of Visits 14    Date for PT Re-Evaluation 06/05/23    Authorization Type [employer]    Authorization Time Period 12 PT VISITS 10/10-12/5    Authorization - Visit Number 5    Authorization - Number of Visits 12    Progress Note Due on Visit 10    PT Start Time 1429    PT Stop Time 1522    PT Time Calculation (min) 53 min    Activity Tolerance Patient tolerated treatment well;No increased pain    Behavior During Therapy [facility] for tasks assessed/performed            History reviewed. No pertinent past medical history. Past Surgical History:  Procedure Laterality Date   hysterectomy     REPLACEMENT TOTAL KNEE Bilateral    ROTATOR CUFF REPAIR     Patient Active Problem List   Diagnosis Date Noted   Neck pain 04/01/2023   Pain in left shoulder 04/01/2023    PCP: Fleet Contras, MD  REFERRING PROVIDER: [place] Bali Persons, PA  REFERRING DIAG: Diagnosis M54.2 (ICD-10-CM) - Neck pain M25.512,G89.29 (ICD-10-CM) - Chronic left shoulder pain  THERAPY DIAG:  Abnormal posture  Muscle weakness (generalized)  Cervicalgia  Radiculopathy, cervical region  Rationale for Evaluation and Treatment: Rehabilitation  ONSET DATE: May of 2024 the radicular stuff started.    SUBJECTIVE:                                                                                                                                                                                      Donyel notes continued progress with her (now minimal) upper extremity symptoms since starting PT.  Symptoms are not as frequent severe.  Symptoms are now rarely present as distal as the hand.  Per eval [name] had a left RTC repair in 2009.  She notes cervical spine, scapular and left arm pain and tingling into the fingers that started in May of this year.  Symptoms have not been improving.  Hand dominance: Right  SUBJECTIVE STATEMENT: 05/14/2023  [name] notes she had to take tylenol and the muscle relaxer 1 x this week (2 x in the previous week).  0-4/10 over the past week.  She can now lie on the left side and feels as if she will continue to improve with her current exercises.   PERTINENT HISTORY: B TKA, rotator cuff repair  PAIN:  Are you having pain? Yes: NPRS scale: 0-4/10 left scapulae and arm (was 3-5/10) on a 10/10 Pain location: Neck, left shoulder blade, left arm Pain description: Ache sore, tingling and numbness Aggravating factors: Lie down, doing better at night but tries to avoid lying on the left side Relieving factors: Lift left arm over head  PRECAUTIONS: Cervical  RED FLAGS: None   WEIGHT BEARING RESTRICTIONS: No  FALLS:  Has patient fallen in last 6 months? No  LIVING ENVIRONMENT: Lives with: lives with their family and lives with their son Lives in: House/apartment Stairs:  She manages Has following equipment at home: Single point cane  OCCUPATION: Retired  PLOF: Independent with household mobility with device  PATIENT GOALS:Get rid of scapular and arm symptoms  NEXT MD VISIT:   OBJECTIVE:  Note: Objective measures were completed at Evaluation unless otherwise noted.  DIAGNOSTIC FINDINGS:  Radiographs of her cervical spine do not show any acute injury she does  have multilevel degenerative changes with joint space narrowing and  sclerotic changes she has loss of  normal lordotic curve  Radiographs of the left shoulder were reviewed today no acute fractures  noted she does have significant degenerative changes with sclerotic and  cystic changes of the humeral head and osteophyte formation.   PATIENT SURVEYS:  05/14/2023 FOTO 58 (Goal met)  Evaluation FOTO 41 (Goal 47 in 14 visits)  COGNITION: Overall cognitive status: Within functional limits for tasks assessed     SENSATION: Notes left scapular and arm symptoms to the hand are infrequent and much less severe  POSTURE: Significant for forward head, IR and protracted shoulders and decreased lumbar lordosis  UPPER EXTREMITY ROM:   Active ROM Left/Right 04/10/2023 R/L 04/17/23 Left/Right 05/07/2023 Left/Right 05/14/2023  Shoulder flexion  154 deg / 138 deg both painless 150/160 150/165  Shoulder extension      Shoulder abduction  142 deg / 108 deg stiff on L, mild pain and does reproduce tingling in arm     Shoulder adduction      Shoulder internal rotation   45/70 55/70  Shoulder external rotation   80/90 90/90  Elbow flexion      Elbow extension      Wrist flexion      Wrist extension      Wrist ulnar deviation      Wrist radial deviation      Wrist pronation      Wrist supination      Cervical rotation 20/30 36/28 30/45  40/45  Cervical lateral bending 25/10  20/0 20/15  Cervical extension  25  30 35  (Blank rows = not tested)  STRENGTH:  Assesed with hand-held dynamometer Left/Right 04/10/2023 Left/Right 04/30/2023 Left/Right 05/07/2023 Left/Right 05/14/2023  Shoulder flexion      Shoulder extension      Shoulder abduction      Shoulder adduction      Shoulder internal rotation   28.3/34.0 27.2/32.2  Shoulder external rotation   15.6/18.3 17.4/19.9  Middle trapezius      Lower trapezius      Elbow flexion      Elbow extension  Wrist flexion      Wrist extension      Wrist ulnar deviation      Wrist radial deviation      Wrist pronation      Wrist supination       Grip strength (lbs)      Cervical extension 14.8 pounds 20.9 pounds  26.4 pounds  Cervical lateral bending 8.6/13.6 pounds 16.6/17.9 pounds    (Blank rows = not tested)    TODAY'S TREATMENT:                                                                                                                                         [facility] Adult PT Treatment:                                                DATE:  05/14/2023 Scapular retraction/shoulder blade pinches (SBP) 10 x 5 seconds Cervical extension isometrics 10 x 5 seconds Cervical rotation AROM (SBP 1st) 10 x 5 second Rows with blue Thera-Band 10 x 5 seconds Shoulder extension with Red Thera-Band 10 x 3 seconds Shoulder ER with Thera-Band Green 10 x 3 seconds Cervical lateral bending AROM 10 x 5 seconds Shoulder IR AROM 10 x 10 seconds Scapular protraction 10 x 3 seconds with 3 pounds  Functional Activities: Reviewed progress note findings, body mechanics including bed mobility and her updated home exercise program for transfer into independent rehabilitation   05/07/2023 Scapular retraction/shoulder blade pinches (SBP) 10 x 5 seconds Cervical extension isometrics 10 x 5 seconds Cervical rotation AROM (SBP 1st) 10 x 5 second Rows with blue Thera-Band 10 x 5 seconds Shoulder extension with Red Thera-Band 10 x 3 seconds Shoulder ER with Thera-Band Green 10 x 3 seconds Cervical lateral bending AROM 10 x 5 seconds  Functional Activities: Reviewed spine model (anatomy) and shoulder model (anatomy of impingement)   04/30/2023 Scapular retraction/shoulder blade pinches (SBP) 10 x 5 seconds Cervical extension isometrics 10 x 5 seconds Cervical rotation AROM (SBP 1st) 10 x 5 second Rows with blue theraband 10 x 5 seconds Cervical lateral bending AROM 10 x 5 seconds  Functional Activities: Postural review and reassessment measure review   PATIENT EDUCATION: Education details: See above Person educated: Patient Education method:  Explanation, Demonstration, Tactile cues, Verbal cues, and Handouts Education comprehension: verbalized understanding, returned demonstration, verbal cues required, tactile cues required, and needs further education  HOME EXERCISE PROGRAM: Access Code: [email or web address] URL: [email or web address] Date: 05/14/2023 Prepared by: Pauletta Browns  Exercises - Standing Scapular Retraction  - 5 x daily - 7 x weekly - 1 sets - 5 reps - 5 second hold - Seated Cervical Rotation AROM  - 1 x daily - 7 x weekly - 1 sets - 10 reps - 5 seconds  hold - Standing Isometric Cervical Extension with Manual Resistance  - 5 x daily - 7 x weekly - 1 sets - 5 reps - 5 hold - Standing Cervical Retraction with Sidebending  - 1 x daily - 7 x weekly - 1 sets - 10 reps - 5 seconds hold - Squatting Shoulder Row with Anchored Resistance  - 1 x daily - 3 x weekly - 1 sets - 10 reps - 5 seconds hold - Shoulder External Rotation with Anchored Resistance  - 1 x daily - 3 x weekly - 1 sets - 10 reps - 3 hold - Supine Scapular Protraction in Flexion with Dumbbells  - 1 x daily - 3 x weekly - 1 sets - 20 reps - 3 seconds hold - Supine Shoulder Internal Rotation Stretch  - 1 x daily - 3 x weekly - 1 sets - 10 reps - 10 seconds hold  ASSESSMENT:  CLINICAL IMPRESSION: 05/14/2023 [name] notes much less frequent and severe left upper extremity symptoms since starting PT.  Objective measures for cervical and shoulder AROM and strength have improved and will benefit from continued work with her updated home exercise program.  With continued HEP compliance and posture/body mechanics work, Janetzy should meet all long-term goals established at evaluation.  Aneesia was given the opportunity to transfer into more independent rehabilitation today and she is decided to take that option.  She knows she is welcome to return for further supervised physical therapy if requested.  If she continues her current home exercise compliance and body mechanics  adherence, I do not think she will need to continue supervised physical therapy.   Per eval - Patient is a 82 y.o. female who was seen today for physical therapy evaluation and treatment for Diagnosis M54.2 (ICD-10-CM) - Neck pain M25.512,G89.29 (ICD-10-CM) - Chronic left shoulder pain.  Symptoms appear to be more related to her cervical radiculopathy although a shoulder screen will be completed at her next visit to rule out a second problem.  Lamika has postural, cervical AROM, cervical, postural and scapular strength impairments that will benefit from the recommended course of physical therapy.  OBJECTIVE IMPAIRMENTS: Abnormal gait, decreased activity tolerance, decreased endurance, decreased knowledge of condition, difficulty walking, decreased ROM, decreased strength, decreased safety awareness, impaired perceived functional ability, impaired sensation, impaired UE functional use, improper body mechanics, postural dysfunction, and pain.   ACTIVITY LIMITATIONS: carrying, lifting, sitting, standing, squatting, reach over head, and locomotion level  PARTICIPATION LIMITATIONS: community activity  PERSONAL FACTORS: B TKA, rotator cuff repair are also affecting patient's functional outcome.   REHAB POTENTIAL: Good  CLINICAL DECISION MAKING: Stable/uncomplicated  EVALUATION COMPLEXITY: Low   GOALS: Goals reviewed with patient? Yes  SHORT TERM GOALS: Target date: 05/08/2023  Riddhima will be independent with her day 1 home exercise program Baseline: Started 04/10/2023 Goal status: Met 04/30/2023  2.  Improve cervical AROM for extension to 45 degrees; right lateral bending to 20 degrees and bilateral rotation to 40 degrees Baseline: 25; 10 and 20/30 degrees respectively Goal status: Partially met 05/14/2023  3.  Improve cervical extension strength to at least 25 pounds Baseline: 14.8 pounds Goal status: Met 05/14/2023  LONG TERM GOALS: Target date: 06/05/2023  Improve FOTO to 47 in 14  visits Baseline: 41 Goal status: Met 05/14/2023  2.  Devanie will report cervical pain consistently 0-4/10 on the numeric pain rating scale with no complaints of scapular or left upper extremity pain Baseline: Pain as high as 5/10 for the cervical spine, left scapula  and left upper extremity Goal status: Improved, Met 05/14/2023  3.  Improve cervical extension strength to at least 35 pounds Baseline: 14.8 pounds Goal status: On Going 05/14/2023  4.  Fleshia will be independent with her long-term home maintenance exercise program at discharge Baseline: Started 04/10/2023 Goal status: Met 05/14/2023  5.  Shakyia will have an improved postural awareness and will be able to implement this into static and dynamic activities Baseline: Flexed posture Goal status: Met 05/14/2023  PLAN:  PT FREQUENCY: DC  PT DURATION: DC  PLANNED INTERVENTIONS: 97110-Therapeutic exercises, 97530- Therapeutic activity, 97112- Neuromuscular re-education, 97535- Self Care, 42595- Manual therapy, 97012- Traction (mechanical), Patient/Family education, Dry Needling, Spinal mobilization, Cryotherapy, and Moist heat  PLAN FOR NEXT SESSION: DC today   Cherlyn Cushing PT, MPT 05/14/2023 4:49 PM

## 2023-06-03 ENCOUNTER — Ambulatory Visit: Payer: Self-pay | Admitting: Cardiology

## 2023-06-13 ENCOUNTER — Encounter: Payer: Self-pay | Admitting: Cardiology

## 2023-06-13 ENCOUNTER — Ambulatory Visit: Payer: Medicare HMO | Attending: Cardiology | Admitting: Cardiology

## 2023-06-13 VITALS — BP 150/86 | HR 82 | Resp 16 | Ht 65.0 in | Wt 245.0 lb

## 2023-06-13 DIAGNOSIS — I77810 Thoracic aortic ectasia: Secondary | ICD-10-CM | POA: Diagnosis not present

## 2023-06-13 DIAGNOSIS — I10 Essential (primary) hypertension: Secondary | ICD-10-CM | POA: Diagnosis not present

## 2023-06-13 DIAGNOSIS — E7800 Pure hypercholesterolemia, unspecified: Secondary | ICD-10-CM | POA: Diagnosis not present

## 2023-06-13 DIAGNOSIS — R9431 Abnormal electrocardiogram [ECG] [EKG]: Secondary | ICD-10-CM

## 2023-06-13 NOTE — Patient Instructions (Signed)
Medication Instructions:  Your physician recommends that you continue on your current medications as directed. Please refer to the Current Medication list given to you today.  *If you need a refill on your cardiac medications before your next appointment, please call your pharmacy*   Lab Work: Have lab work done at [facility] on the first floor of our building today--BMP and Magnesium If you have labs (blood work) drawn today and your tests are completely normal, you will receive your results only by: [employer] (if you have MyChart) OR A paper copy in the mail If you have any lab test that is abnormal or we need to change your treatment, we will call you to review the results.   Testing/Procedures: Your physician has requested that you have an echocardiogram. Echocardiography is a painless test that uses sound waves to create images of your heart. It provides your doctor with information about the size and shape of your heart and how well your heart's chambers and valves are working. This procedure takes approximately one hour. There are no restrictions for this procedure. Please do NOT wear cologne, perfume, aftershave, or lotions (deodorant is allowed). Please arrive 15 minutes prior to your appointment time.  Please note: We ask at that you not bring children with you during ultrasound (echo/ vascular) testing. Due to room size and safety concerns, children are not allowed in the ultrasound rooms during exams. Our front office staff cannot provide observation of children in our lobby area while testing is being conducted. An adult accompanying a patient to their appointment will only be allowed in the ultrasound room at the discretion of the ultrasound technician under special circumstances. We apologize for any inconvenience.    Follow-Up: At [facility], you and your health needs are our priority.  As part of our continuing mission to provide you with exceptional heart care,  we have created designated Provider Care Teams.  These Care Teams include your primary Cardiologist (physician) and Advanced Practice Providers (APPs -  Physician Assistants and Nurse Practitioners) who all work together to provide you with the care you need, when you need it.  We recommend signing up for the patient portal called "MyChart".  Sign up information is provided on this After Visit Summary.  MyChart is used to connect with patients for Virtual Visits (Telemedicine).  Patients are able to view lab/test results, encounter notes, upcoming appointments, etc.  Non-urgent messages can be sent to your provider as well.   To learn more about what you can do with MyChart, go to [email or web address].    Your next appointment:   1 month(s)  Provider:   Jari Favre, PA-C, Ronie Spies, PA-C, Robin Searing, NP, Jacolyn Reedy, PA-C, Eligha Bridegroom, NP, Tereso Newcomer, PA-C, or Perlie Gold, PA-C         Other Instructions

## 2023-06-13 NOTE — Progress Notes (Signed)
Cardiology Office Note:  .   Date:  06/13/2023  ID:  [name], DOB 1941/06/18, MRN [id number] PCP: Fleet Contras, MD  [facility] Providers Cardiologist:  None   History of Present Illness: .   [name] is a 82 y.o. female patient with hypertension, hypercholesterolemia, mild ascending aortic dilatation presents here for 89-month office visit.  Discussed the use of AI scribe software for clinical note transcription with the patient, who gave verbal consent to proceed.  History of Present Illness   Miss [name], a patient with a history of hypertension and ascending aortic aneurysm, presents with a concern of tingling and numbness. She denies any cardiac symptoms. She reports that her blood pressure has been slightly elevated recently, with a reading of 160/74 three days ago. She attributes this to possibly getting up too quickly after lying down. She also mentions a lack of exercise and increased eating, which may be contributing to her health issues. She is currently on Losartan for her hypertension.      Review of Systems  Cardiovascular:  Positive for dyspnea on exertion (stable) and leg swelling (occasional). Negative for chest pain.    Labs    Lab Results  Component Value Date   NA 139 11/18/2007   K 3.8 11/18/2007   CO2 31 11/18/2007   GLUCOSE 107 (H) 11/18/2007   BUN 19 11/18/2007   CREATININE 0.98 11/18/2007   CALCIUM 9.3 11/18/2007   GFRNONAA 57 (L) 11/18/2007      11/18/2007   12:43 PM  BMP  Glucose 107   BUN 19   Creatinine 0.98   Sodium 139   Potassium 3.8   Chloride 102   CO2 31   Calcium 9.3     External Labs:  Labs 01/11/2022:  Total cholesterol 143, triglycerides 69, HDL 61, LDL 68.  Hb 11.8/HCT 38.2,  Sodium 135, potassium 4.6, BUN 18, creatinine 1.05, EGFR 54 mL, LFTs normal.  TSH normal.  Physical Exam:   VS:  BP (!) 150/86 Comment: left arm  Pulse 82   Resp 16   Ht 5\' 5"  (1.651 m)   Wt 245 lb (111.1 kg)   SpO2 98%    BMI 40.77 kg/m    Wt Readings from Last 3 Encounters:  06/13/23 245 lb (111.1 kg)  12/02/22 240 lb (108.9 kg)  07/29/22 248 lb (112.5 kg)     Physical Exam Constitutional:      Appearance: She is morbidly obese.  Neck:     Vascular: No carotid bruit or JVD.  Cardiovascular:     Rate and Rhythm: Normal rate and regular rhythm.     Pulses: Intact distal pulses.     Heart sounds: Normal heart sounds. No murmur heard.    No gallop.  Pulmonary:     Effort: Pulmonary effort is normal.     Breath sounds: Normal breath sounds.  Abdominal:     General: Bowel sounds are normal.     Palpations: Abdomen is soft.  Musculoskeletal:     Right lower leg: No edema.     Left lower leg: No edema.     Studies Reviewed: .    Echocardiogram 08/02/2022: Normal LV systolic function with visual EF 55-60%. Left ventricle cavity is normal in size. Mild concentric hypertrophy of the left ventricle. Normal global wall motion. Doppler evidence of grade I (impaired) diastolic dysfunction, normal LAP. Calculated EF 63%. Trileaflet aortic valve.  Trace aortic regurgitation. Mild aortic valve leaflet calcification. Structurally  normal mitral valve.  Mild (Grade I) mitral regurgitation. Structurally normal tricuspid valve.  Mild tricuspid regurgitation. Mild pulmonary hypertension. RVSP measures 30 mmHg. The aortic root is normal. 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 No prior available for comparison.   Regadenoson (with Mod Bruce protocol) Nuclear stress test 07/30/2022 Myocardial perfusion is normal. Overall LV systolic function is normal without regional wall motion abnormalities. Stress LV EF: 75%. Low risk study. Nondiagnostic ECG stress. The heart rate response was consistent with Regadenoson. The blood pressure response was physiologic. No previous exam available for comparison. EKG:    EKG Interpretation Date/Time:  Friday June 13 2023 08:08:57 EST Ventricular Rate:  84 PR  Interval:  182 QRS Duration:  70 QT Interval:  472 QTC Calculation: 557 R Axis:   41  Text Interpretation: EKG 06/13/2023: Normal sinus rhythm with rate of 84 bpm, normal axis, cannot exclude septal infarct old.  Nonspecific T abnormality prolonged QTc at 557 ms. Confirmed by Delrae Rend [phone]) on 06/13/2023 8:17:50 AM    Medications and allergies    Allergies  Allergen Reactions   Tramadol Nausea And Vomiting    `     Current Outpatient Medications:    amLODipine (NORVASC) 10 MG tablet, Take 10 mg by mouth daily., Disp: , Rfl:    aspirin EC 81 MG tablet, Take 81 mg by mouth daily. Swallow whole., Disp: , Rfl:    atorvastatin (LIPITOR) 40 MG tablet, Take 40 mg by mouth daily., Disp: , Rfl:    carvedilol (COREG) 6.25 MG tablet, Take 6.25 mg by mouth 2 (two) times daily., Disp: , Rfl:    losartan-hydrochlorothiazide (HYZAAR) 100-12.5 MG tablet, Take 1 tablet by mouth daily., Disp: , Rfl:    nitroGLYCERIN (NITROSTAT) 0.4 MG SL tablet, Place 1 tablet (0.4 mg total) under the tongue every 5 (five) minutes as needed for chest pain., Disp: 90 tablet, Rfl: 3   tiZANidine (ZANAFLEX) 4 MG tablet, Take 4 mg by mouth at bedtime., Disp: , Rfl:    ASSESSMENT AND PLAN: .      ICD-10-CM   1. Primary hypercholesterolemia  E78.00     2. Ascending aorta dilatation (HCC)  I77.810 EKG 12-Lead    Basic Metabolic Panel (BMET)    Magnesium    ECHOCARDIOGRAM COMPLETE    3. Essential hypertension  I10 Basic Metabolic Panel (BMET)    Magnesium    4. Nonspecific abnormal electrocardiogram (ECG) (EKG)  R94.31 Basic Metabolic Panel (BMET)    Magnesium    ECHOCARDIOGRAM COMPLETE     Assessment and Plan    Hypertension Blood pressure slightly elevated today, but patient reports it is usually well controlled. Patient has been less active and eating more recently. -Change Losartan 100 mg to Losartan 100/12.5 mg in morning as she states blood pressure is usually well-controlled hence do not want  to make any more aggressive change.   -Check Basic Metabolic Panel (BMP) and Magnesium levels today.  Ascending Aortic Aneurysm No new symptoms reported. -Order echocardiogram in one month to monitor aneurysm. -Follow-up appointment with a PA or nurse practitioner in one month.  If no significant change in the ascending aortic aneurysmal dilatation, consider repeating echocardiogram once every other year.  Hypercholesterolemia She is presently on atorvastatin 40 mg for hypercholesterolemia, lipids being managed by PCP.  Goal LDL <100.  Abnormal EKG Prolonged QT nonspecific, will obtain magnesium levels along with BMP.  General Health Maintenance / Followup Plans -Return visit in early February to review results of echocardiogram  and blood work, and assess blood pressure control.  If blood pressure is not well-controlled, could consider increasing carvedilol dose to 12.5 mg twice daily. -If all results are normal, patient can travel as planned in the third week of February to [place], we can see her back every other year for follow-up of ascending aortic aneurysm.  Signed,  Yates Decamp, MD, [facility] 06/13/2023, 12:55 PM [facility] [address] [place], [place] [address] Phone: [phone]. Fax:  [phone]

## 2023-06-14 LAB — BASIC METABOLIC PANEL
BUN/Creatinine Ratio: 20 (ref 12–28)
BUN: 19 mg/dL (ref 8–27)
CO2: 28 mmol/L (ref 20–29)
Calcium: 9.4 mg/dL (ref 8.7–10.3)
Chloride: 102 mmol/L (ref 96–106)
Creatinine, Ser: 0.93 mg/dL (ref 0.57–1.00)
Glucose: 98 mg/dL (ref 70–99)
Potassium: 4 mmol/L (ref 3.5–5.2)
Sodium: 141 mmol/L (ref 134–144)
eGFR: 62 mL/min/{1.73_m2} (ref >59)

## 2023-06-14 LAB — MAGNESIUM: Magnesium: 2.1 mg/dL (ref 1.6–2.3)

## 2023-06-15 NOTE — Progress Notes (Signed)
Normal labs, normal renal functiona nd Mg levels

## 2023-06-19 ENCOUNTER — Ambulatory Visit (HOSPITAL_COMMUNITY): Payer: Medicare HMO | Attending: Cardiology

## 2023-06-19 DIAGNOSIS — R9431 Abnormal electrocardiogram [ECG] [EKG]: Secondary | ICD-10-CM | POA: Insufficient documentation

## 2023-06-19 DIAGNOSIS — I77810 Thoracic aortic ectasia: Secondary | ICD-10-CM | POA: Diagnosis present

## 2023-06-19 LAB — ECHOCARDIOGRAM COMPLETE
AR max vel: 3.02 cm2
AV Area VTI: 2.96 cm2
AV Area mean vel: 2.96 cm2
AV Mean grad: 5.2 mm[Hg]
AV Peak grad: 9.3 mm[Hg]
Ao pk vel: 1.52 m/s
Area-P 1/2: 4.79 cm2
MV VTI: 4.07 cm2
P 1/2 time: 369 ms
S' Lateral: 2.15 cm

## 2023-06-19 NOTE — Progress Notes (Signed)
Very stable echo from Echocardiogram 08/02/2022.  Hyperdynamic LV due to lack of physical activity. Would benefit from joining exercise program. Aortic root dilatation is very mild and stable

## 2023-07-13 NOTE — Progress Notes (Signed)
 Cardiology Office Note    Patient Name: [name] Date of Encounter: 07/15/2023  Primary Care Provider:  Shelda Atlas, MD Primary Cardiologist:  None Primary Electrophysiologist: None   Past Medical History    History reviewed. No pertinent past medical history.  History of Present Illness  [name] is a 83 y.o. 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  Ms. Swartzlander was seen initially for complaint of chest pain and dyspnea on exertion. 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 She was last seen by Dr. Ladona on 06/13/2023 for 6 month follow up and BP was elevated at  160/74 that she attributed to standing too quickly. She reported that BP was well controlled at home. She had a repeat echo that showed hyperdynamic LV with stable root dilation.  Ms. Holzman presents today for 1 month follow-up.  During today's visit she reports doing well and has not experienced any new cardiac complaints.  She report that their blood pressure was elevated at their last appointment in December, but they believe it is better controlled at home. This morning, two hours after taking their medication, they measured their blood pressure at 117/65. However, at today's appointment, their blood pressure is still slightly elevated. The patient's EKG is normal and there have been no significant changes since their last appointment in December. The patient also has some dilation in the aorta, which is being monitored annually. The dilation is currently stable and has not worsened since the last echocardiogram. The patient is currently taking Lipitor, Norvasc , Hyzaar, and nitrostat  and reports no new complaints or concerns since their last appointment.  Review of Systems  Please see the  history of present illness.    All other systems reviewed and are otherwise negative except as noted above.  Physical Exam    Wt Readings from Last 3 Encounters:  07/15/23 242 lb 9.6 oz (110 kg)  06/13/23 245 lb (111.1 kg)  12/02/22 240 lb (108.9 kg)   VS: Vitals:   07/15/23 1407 07/15/23 1503  BP: (!) 154/80 (!) 142/84  Pulse: 76   SpO2: 97%   ,Body mass index is 40.37 kg/m. GEN: Well nourished, well developed in no acute distress Neck: No JVD; No carotid bruits Pulmonary: Clear to auscultation without rales, wheezing or rhonchi  Cardiovascular: Normal rate. Regular rhythm. Normal S1. Normal S2.   Murmurs: There is no murmur.  ABDOMEN: Soft, non-tender, non-distended EXTREMITIES:  No edema; No deformity   EKG/LABS/ Recent Cardiac Studies   ECG personally reviewed by me today -sinus rhythm with rate of 76 bpm and normal QT interval with no acute changes noted.  Risk Assessment/Calculations:          Lab Results  Component Value Date   HGB 13.5 POINT OF CARE RESULT 11/19/2007   Lab Results  Component Value Date   CREATININE 0.93 06/13/2023   BUN 19 06/13/2023   NA 141 06/13/2023   K 4.0 06/13/2023   CL 102 06/13/2023   CO2 28 06/13/2023   No results found for: CHOL, HDL, LDLCALC, LDLDIRECT, TRIG, CHOLHDL  No results found for: HGBA1C Assessment & Plan    1.Essential HTN: -HYPERTENSION CONTROL Vitals:   07/15/23 1407 07/15/23 1503  BP: ROLLEN)  154/80 (!) 142/84    The patient's blood pressure is elevated above target today.  In order to address the patient's elevated BP: Blood pressure will be monitored at home to determine if medication changes need to be made.     -Continue Norvasc  10 mg, Hyzaar 100-12.5 mg, carvedilol  6.25 mg twice daily -Patient will monitor BP and return readings in 1 week. -We will plan to increase Hyzaar he is above goal of 130/80  2.HLD: Patient on Lipitor, no reported side effects. Importance of medication adherence  discussed. -Request recent cholesterol levels from primary care physician. -Continue Lipitor 40 mg as prescribed.  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 Importance of blood pressure and cholesterol control discussed to prevent further dilation. -Continue current medications and lifestyle modifications. -Repeat echocardiogram in December 2026.  4.  Dyspnea on exertion: -Patient reports some dyspnea on exertion with strenuous activity -She was advised to contact our office if shortness of breath or dyspnea increases.      Disposition: Follow-up with None or APP in 6 months    Signed, Wyn Raddle, Jackee Shove, NP 07/15/2023, 3:04 PM [facility]

## 2023-07-15 ENCOUNTER — Ambulatory Visit: Payer: Medicare Other | Attending: Nurse Practitioner | Admitting: Nurse Practitioner

## 2023-07-15 ENCOUNTER — Encounter: Payer: Self-pay | Admitting: Nurse Practitioner

## 2023-07-15 VITALS — BP 142/84 | HR 76 | Ht 65.0 in | Wt 242.6 lb

## 2023-07-15 DIAGNOSIS — R0609 Other forms of dyspnea: Secondary | ICD-10-CM

## 2023-07-15 DIAGNOSIS — I10 Essential (primary) hypertension: Secondary | ICD-10-CM | POA: Diagnosis not present

## 2023-07-15 DIAGNOSIS — E782 Mixed hyperlipidemia: Secondary | ICD-10-CM

## 2023-07-15 DIAGNOSIS — I77810 Thoracic aortic ectasia: Secondary | ICD-10-CM | POA: Diagnosis not present

## 2023-07-15 MED ORDER — CARVEDILOL 6.25 MG PO TABS: 6.2500 mg | ORAL_TABLET | Freq: Two times a day (BID) | ORAL | 3 refills | Status: DC

## 2023-07-15 MED ORDER — LOSARTAN POTASSIUM-HCTZ 100-12.5 MG PO TABS: 1.0000 | ORAL_TABLET | Freq: Every day | ORAL | 3 refills | Status: DC

## 2023-07-15 MED ORDER — ATORVASTATIN CALCIUM 40 MG PO TABS: 40.0000 mg | ORAL_TABLET | Freq: Every day | ORAL | 3 refills | Status: DC

## 2023-07-15 MED ORDER — AMLODIPINE BESYLATE 10 MG PO TABS: 10.0000 mg | ORAL_TABLET | Freq: Every day | ORAL | 3 refills | Status: DC

## 2023-07-15 NOTE — Patient Instructions (Addendum)
 Medication Instructions:   Your physician recommends that you continue on your current medications as directed. Please refer to the Current Medication list given to you today.   *If you need a refill on your cardiac medications before your next appointment, please call your pharmacy*   Lab Work:  NONE ORDERED  TODAY    If you have labs (blood work) drawn today and your tests are completely normal, you will receive your results only by: MyChart Message (if you have MyChart) OR A paper copy in the mail If you have any lab test that is abnormal or we need to change your treatment, we will call you to review the results.   Testing/Procedures: NONE ORDERED  TODAY      Follow-Up: At [facility], you and your health needs are our priority.  As part of our continuing mission to provide you with exceptional heart care, we have created designated Provider Care Teams.  These Care Teams include your primary Cardiologist (physician) and Advanced Practice Providers (APPs -  Physician Assistants and Nurse Practitioners) who all work together to provide you with the care you need, when you need it.  We recommend signing up for the patient portal called MyChart.  Sign up information is provided on this After Visit Summary.  MyChart is used to connect with patients for Virtual Visits (Telemedicine).  Patients are able to view lab/test results, encounter notes, upcoming appointments, etc.  Non-urgent messages can be sent to your provider as well.   To learn more about what you can do with MyChart, go to [email or web address].    Your next appointment:    6 month(s)  Provider:    Dr. Margaretann   Other Instructions  KEEP LOG OF BLOOD PRESSURE  FOR ONE WEEK  AND CALL  BACK WITH READINGS

## 2024-02-04 ENCOUNTER — Other Ambulatory Visit: Payer: Self-pay | Admitting: Internal Medicine

## 2024-02-04 DIAGNOSIS — Z1231 Encounter for screening mammogram for malignant neoplasm of breast: Secondary | ICD-10-CM

## 2024-03-29 ENCOUNTER — Ambulatory Visit
Admission: RE | Admit: 2024-03-29 | Discharge: 2024-03-29 | Disposition: A | Discharge status: Home / Self Care | Source: Ambulatory Visit | Attending: Internal Medicine | Admitting: Internal Medicine

## 2024-03-29 DIAGNOSIS — Z1231 Encounter for screening mammogram for malignant neoplasm of breast: Secondary | ICD-10-CM

## 2024-06-30 ENCOUNTER — Other Ambulatory Visit: Payer: Self-pay | Admitting: Nurse Practitioner

## 2024-07-08 ENCOUNTER — Other Ambulatory Visit: Payer: Self-pay | Admitting: Nurse Practitioner

## 2024-07-27 ENCOUNTER — Other Ambulatory Visit: Payer: Self-pay | Admitting: Cardiology
